# Patient Record
Sex: Female | Born: 1955 | Race: White | Hispanic: No | Marital: Married | Smoking: Never smoker
Health system: Southern US, Community
[De-identification: ages and names within clinical notes are randomized; demographics above are authoritative.]

## PROBLEM LIST (undated history)

## (undated) DIAGNOSIS — C801 Malignant (primary) neoplasm, unspecified: Secondary | ICD-10-CM

## (undated) DIAGNOSIS — J45909 Unspecified asthma, uncomplicated: Secondary | ICD-10-CM

## (undated) HISTORY — PX: MASTECTOMY: SHX3

## (undated) HISTORY — PX: BREAST SURGERY: SHX581

## (undated) HISTORY — PX: TONSILLECTOMY: SUR1361

---

## 2008-05-15 ENCOUNTER — Encounter: Payer: Self-pay | Admitting: Cardiology

## 2012-10-09 ENCOUNTER — Other Ambulatory Visit: Payer: Self-pay | Admitting: Gastroenterology

## 2012-10-16 ENCOUNTER — Encounter (HOSPITAL_BASED_OUTPATIENT_CLINIC_OR_DEPARTMENT_OTHER): Payer: Self-pay | Admitting: *Deleted

## 2012-10-16 ENCOUNTER — Emergency Department (HOSPITAL_BASED_OUTPATIENT_CLINIC_OR_DEPARTMENT_OTHER)
Admission: EM | Admit: 2012-10-16 | Discharge: 2012-10-16 | Disposition: A | Discharge status: Home / Self Care | Payer: BC Managed Care – PPO | Attending: Emergency Medicine | Admitting: Emergency Medicine

## 2012-10-16 ENCOUNTER — Emergency Department (HOSPITAL_BASED_OUTPATIENT_CLINIC_OR_DEPARTMENT_OTHER): Payer: BC Managed Care – PPO

## 2012-10-16 DIAGNOSIS — Z792 Long term (current) use of antibiotics: Secondary | ICD-10-CM | POA: Insufficient documentation

## 2012-10-16 DIAGNOSIS — R109 Unspecified abdominal pain: Secondary | ICD-10-CM

## 2012-10-16 DIAGNOSIS — R112 Nausea with vomiting, unspecified: Secondary | ICD-10-CM | POA: Insufficient documentation

## 2012-10-16 DIAGNOSIS — R1032 Left lower quadrant pain: Secondary | ICD-10-CM | POA: Insufficient documentation

## 2012-10-16 DIAGNOSIS — R824 Acetonuria: Secondary | ICD-10-CM | POA: Insufficient documentation

## 2012-10-16 DIAGNOSIS — Z853 Personal history of malignant neoplasm of breast: Secondary | ICD-10-CM | POA: Insufficient documentation

## 2012-10-16 DIAGNOSIS — R197 Diarrhea, unspecified: Secondary | ICD-10-CM | POA: Insufficient documentation

## 2012-10-16 HISTORY — DX: Malignant (primary) neoplasm, unspecified: C80.1

## 2012-10-16 LAB — CBC WITH DIFFERENTIAL/PLATELET
Basophils Absolute: 0 10*3/uL (ref 0.0–0.1)
Basophils Relative: 0 % (ref 0–1)
Eosinophils Relative: 1 % (ref 0–5)
HCT: 39.6 % (ref 36.0–46.0)
MCH: 29.2 pg (ref 26.0–34.0)
MCHC: 35.1 g/dL (ref 30.0–36.0)
MCV: 83.2 fL (ref 78.0–100.0)
Monocytes Absolute: 0.9 10*3/uL (ref 0.1–1.0)
RDW: 13.5 % (ref 11.5–15.5)

## 2012-10-16 LAB — URINALYSIS, ROUTINE W REFLEX MICROSCOPIC
Glucose, UA: NEGATIVE mg/dL
Leukocytes, UA: NEGATIVE
Nitrite: NEGATIVE
Protein, ur: NEGATIVE mg/dL
pH: 5 (ref 5.0–8.0)

## 2012-10-16 LAB — COMPREHENSIVE METABOLIC PANEL
AST: 35 U/L (ref 0–37)
Albumin: 4.2 g/dL (ref 3.5–5.2)
Calcium: 9.9 mg/dL (ref 8.4–10.5)
Creatinine, Ser: 1 mg/dL (ref 0.50–1.10)
GFR calc non Af Amer: 62 mL/min — ABNORMAL LOW (ref >90)

## 2012-10-16 LAB — LIPASE, BLOOD: Lipase: 37 U/L (ref 11–59)

## 2012-10-16 LAB — URINE MICROSCOPIC-ADD ON

## 2012-10-16 MED ORDER — IOHEXOL 300 MG/ML  SOLN
50.0000 mL | Freq: Once | INTRAMUSCULAR | Status: AC | PRN
  Administered 2012-10-16: 50 mL via ORAL

## 2012-10-16 MED ORDER — IOHEXOL 300 MG/ML  SOLN
100.0000 mL | Freq: Once | INTRAMUSCULAR | Status: AC | PRN
  Administered 2012-10-16: 100 mL via INTRAVENOUS

## 2012-10-16 MED ORDER — ONDANSETRON HCL 4 MG/2ML IJ SOLN
INTRAMUSCULAR | Status: AC
  Administered 2012-10-16: 4 mg via INTRAVENOUS
  Filled 2012-10-16: qty 2

## 2012-10-16 MED ORDER — ONDANSETRON HCL 4 MG/2ML IJ SOLN
4.0000 mg | Freq: Once | INTRAMUSCULAR | Status: AC
  Administered 2012-10-16: 4 mg via INTRAVENOUS

## 2012-10-16 MED ORDER — SODIUM CHLORIDE 0.9 % IV SOLN
Freq: Once | INTRAVENOUS | Status: AC
  Administered 2012-10-16: 21:00:00 via INTRAVENOUS

## 2012-10-16 MED ORDER — SODIUM CHLORIDE 0.9 % IV SOLN
1000.0000 mL | Freq: Once | INTRAVENOUS | Status: AC
  Administered 2012-10-16: 1000 mL via INTRAVENOUS

## 2012-10-16 MED ORDER — ONDANSETRON HCL 4 MG/2ML IJ SOLN
4.0000 mg | Freq: Once | INTRAMUSCULAR | Status: AC
  Administered 2012-10-16: 4 mg via INTRAVENOUS
  Filled 2012-10-16: qty 2

## 2012-10-16 MED ORDER — ONDANSETRON 8 MG PO TBDP: 8.0000 mg | ORAL_TABLET | Freq: Three times a day (TID) | ORAL | Status: DC | PRN

## 2012-10-16 NOTE — ED Notes (Addendum)
Vomiting diarrhea abdominal bloating x 2 days. Pale. Was recently treated with Augmentin for a sinus infection

## 2012-10-16 NOTE — ED Notes (Signed)
Pt. Walked to restroom and still unable to urinate.

## 2012-10-16 NOTE — ED Provider Notes (Signed)
History  This chart was scribed for Gerhard Munch, MD by Bennett Scrape, ED Scribe. This patient was seen in room MH03/MH03 and the patient's care was started at 6:45 PM.  CSN: [id number]  Arrival date & time 10/16/12  1825   First MD Initiated Contact with Patient 10/16/12 1845      Chief Complaint  Patient presents with  . Emesis     The history is provided by the patient. No language interpreter was used.    Kelli Crane is a 57 y.o. female who presents to the Emergency Department complaining of 2 days of gradual onset, non-changing, constant nausea with associated non-bloody emesis, non-bloody diarrhea and LLQ abdominal pain that started yesterday. Last BM was earlier today and last episode of emesis was 2 hours ago. She reports having abdominal pain currently.She reports breast CA in 2009 that was treated with one year of chemotherapy and CLL in 2012 treated with 6 chemotherapy treatments in 8 weeks, last treatment was June 2013. She states that she is currently CA free. She states that she has experienced prior episode of the same when she was receiving chemotherapy. She also states that she was recntly started on antibiotics for a sinus infection 3 weeks ago that was recent changed to Augmentin on week ago. She reports continuous post-nasal drip since the change. She denies fevers, chills, CP, SOB, leg swelling, and rash as associated symptoms. She denies smoking and alcohol use.  Oncologist is Dr. Hanley Hays in [facility], sees him every 3 months  Past Medical History  Diagnosis Date  . Cancer     Past Surgical History  Procedure Laterality Date  . Mastectomy    . Tonsillectomy    tubal ligation per pt at the bedside  No family history on file.  History  Substance Use Topics  . Smoking status: Never Smoker   . Smokeless tobacco: Not on file  . Alcohol Use: No    No OB history provided.  Review of Systems  Constitutional:       Per HPI, otherwise negative   HENT:       Per HPI, otherwise negative  Respiratory:       Per HPI, otherwise negative  Cardiovascular:       Per HPI, otherwise negative  Gastrointestinal: Positive for nausea, vomiting, abdominal pain and diarrhea. Negative for blood in stool.  Endocrine:       Negative aside from HPI  Genitourinary:       Neg aside from HPI   Musculoskeletal:       Per HPI, otherwise negative  Skin: Negative.   Neurological: Negative for syncope.    Allergies  Erythromycin  Home Medications   Current Outpatient Rx  Name  Route  Sig  Dispense  Refill  . Amoxicillin-Pot Clavulanate (AUGMENTIN PO)   Oral   Take by mouth.           Triage Vitals: BP 117/73  Pulse 91  Temp(Src) 98.2 F (36.8 C) (Oral)  Resp 18  SpO2 100%  Physical Exam  Nursing note and vitals reviewed. Constitutional: She is oriented to person, place, and time. She appears well-developed and well-nourished. No distress.  HENT:  Head: Normocephalic and atraumatic.  Eyes: Conjunctivae and EOM are normal.  Neck: Neck supple. No tracheal deviation present.  Cardiovascular: Normal rate and regular rhythm.   Pulmonary/Chest: Effort normal and breath sounds normal. No stridor. No respiratory distress.  Abdominal: Soft. She exhibits no distension. There is tenderness (diffuse  abdominal tenderness, worse in the LLQ). There is no rebound and no guarding.  Musculoskeletal: Normal range of motion. She exhibits no edema.  Neurological: She is alert and oriented to person, place, and time. No cranial nerve deficit.  Skin: Skin is warm and dry.  Psychiatric: She has a normal mood and affect. Her behavior is normal.    ED Course  Procedures (including critical care time)  DIAGNOSTIC STUDIES: Oxygen Saturation is 100% on room air, normal by my interpretation.    COORDINATION OF CARE: 7:11 PM-Discussed treatment plan which includes CT of abdomen, CBC, UA and lipase with pt at bedside and pt agreed to plan.   7:15 PM-  Ordered 4 mg Zofan injection  8:29 PM- Ordered 4 mg Zofran injection  Labs Reviewed  CBC WITH DIFFERENTIAL - Abnormal; Notable for the following:    Platelets 127 (*)    All other components within normal limits  COMPREHENSIVE METABOLIC PANEL - Abnormal; Notable for the following:    BUN 25 (*)    GFR calc non Af Amer 62 (*)    GFR calc Af Amer 72 (*)    All other components within normal limits  URINALYSIS, ROUTINE W REFLEX MICROSCOPIC - Abnormal; Notable for the following:    Hgb urine dipstick TRACE (*)    Bilirubin Urine SMALL (*)    Ketones, ur >80 (*)    All other components within normal limits  LIPASE, BLOOD  URINE MICROSCOPIC-ADD ON   No results found.   No diagnosis found.  10:56 PM The patient and her daughter were made aware of all results.  She has significant improvement since admission.  MDM   I personally performed the services described in this documentation, which was scribed in my presence. The recorded information has been reviewed and is accurate.  This 57 year old female presents with ongoing nausea, vomiting, diarrhea.  On exam the patient is afebrile, mentating appropriately.  The patient presents with IV fluids, and CT scan did demonstrate acute findings.  The patient was made aware of her need for repeat evaluation of her liver abnormality.  The patient's labs are suggestive of dehydration with ketonuria.  The patient has normal bicarbonate, and improved substantially with fluids.  We discussed return precautions, and the patient was discharged in stable condition.    Gerhard Munch, MD 10/16/12 2258

## 2012-10-16 NOTE — ED Notes (Signed)
Pt. Was given zofran at 2032.  Linking the med was a problem in the computer.

## 2013-11-19 ENCOUNTER — Other Ambulatory Visit: Payer: Self-pay | Admitting: Gastroenterology

## 2015-01-08 ENCOUNTER — Other Ambulatory Visit: Payer: Self-pay | Admitting: Gastroenterology

## 2015-02-11 ENCOUNTER — Other Ambulatory Visit: Payer: Self-pay | Admitting: Gastroenterology

## 2015-04-07 ENCOUNTER — Other Ambulatory Visit: Payer: Self-pay | Admitting: Gastroenterology

## 2015-05-12 ENCOUNTER — Other Ambulatory Visit: Payer: Self-pay | Admitting: Gastroenterology

## 2015-06-06 ENCOUNTER — Other Ambulatory Visit: Payer: Self-pay | Admitting: Gastroenterology

## 2015-09-26 ENCOUNTER — Other Ambulatory Visit: Payer: Self-pay | Admitting: Gastroenterology

## 2016-01-14 ENCOUNTER — Other Ambulatory Visit: Payer: Self-pay | Admitting: Gastroenterology

## 2016-08-16 ENCOUNTER — Other Ambulatory Visit: Payer: Self-pay | Admitting: Gastroenterology

## 2017-01-21 ENCOUNTER — Encounter: Payer: Self-pay | Admitting: Emergency Medicine

## 2017-01-21 ENCOUNTER — Emergency Department (INDEPENDENT_AMBULATORY_CARE_PROVIDER_SITE_OTHER)
Admission: EM | Admit: 2017-01-21 | Discharge: 2017-01-21 | Disposition: A | Discharge status: Home / Self Care | Payer: BLUE CROSS/BLUE SHIELD | Source: Home / Self Care | Attending: Family Medicine | Admitting: Family Medicine

## 2017-01-21 DIAGNOSIS — Q846 Other congenital malformations of nails: Secondary | ICD-10-CM | POA: Diagnosis not present

## 2017-01-21 HISTORY — DX: Unspecified asthma, uncomplicated: J45.909

## 2017-01-21 MED ORDER — MUPIROCIN CALCIUM 2 % EX CREA: 1.0000 "application " | TOPICAL_CREAM | Freq: Three times a day (TID) | CUTANEOUS | 0 refills | Status: DC

## 2017-01-21 NOTE — ED Triage Notes (Signed)
Left great toe infection started on 4/27 took Bactrim didn't get better then was put on Clindamycin which caused vomiting and diarrhea.Patient is from [place], here visiting

## 2017-01-21 NOTE — ED Provider Notes (Signed)
[facility]    CSN: [id number] Arrival date & time: 01/21/17  1902     History   Chief Complaint Chief Complaint  Patient presents with  . Toe Pain    HPI Kelli Crane is a 61 y.o. female.   Patient is visiting from [place].  About one month ago she was prescribed Bactrim for a left great toe infection.  She had no improvement and was then prescribed Clindamycin, which caused vomiting and diarrhea.  Her GI symptoms resolved when she discontinued the Clindamycin.  She still has some soreness at the base of her toenail, although improved considerably.  No drainage from the toe.   The history is provided by the patient.  Toe Pain  This is a new problem. Episode onset: one month ago. The problem occurs constantly. The problem has been gradually improving. The symptoms are aggravated by walking. Nothing relieves the symptoms. Treatments tried: clindamycin. The treatment provided significant relief.    Past Medical History:  Diagnosis Date  . Asthma   . Cancer ([place])     There are no active problems to display for this patient.   Past Surgical History:  Procedure Laterality Date  . BREAST SURGERY    . MASTECTOMY    . TONSILLECTOMY      OB History    No data available       Home Medications    Prior to Admission medications   Medication Sig Start Date End Date Taking? Authorizing Provider  cetirizine (ZYRTEC) 10 MG tablet Take 10 mg by mouth daily.   Yes [provider]  Ibrutinib 420 MG TABS Take by mouth.   Yes [provider]  lansoprazole (PREVACID) 30 MG capsule Take 30 mg by mouth daily at 12 noon.   Yes [provider]  mometasone (NASONEX) 50 MCG/ACT nasal spray Place 2 sprays into the nose daily.   Yes [provider]  montelukast (SINGULAIR) 10 MG tablet Take 10 mg by mouth at bedtime.   Yes [provider]  mupirocin cream (BACTROBAN) 2 % Apply 1 application topically 3 (three) times daily.  01/21/17   Kandra Nicolas, MD    Family History Family History  Problem Relation Age of Onset  . Hypertension Mother   . Depression Mother   . Cancer Mother   . Cancer Sister     Social History Social History  Substance Use Topics  . Smoking status: Never Smoker  . Smokeless tobacco: Never Used  . Alcohol use No     Allergies   Amoxicillin; Clarithromycin; Clindamycin/lincomycin; and Erythromycin   Review of Systems Review of Systems  All other systems reviewed and are negative.    Physical Exam Triage Vital Signs ED Triage Vitals [01/21/17 1917]  Enc Vitals Group     BP 114/69     Pulse Rate 81     Resp      Temp 98.4 F (36.9 C)     Temp Source Oral     SpO2 95 %     Weight 150 lb (68 kg)     Height 5\' 8"  (1.727 m)     Head Circumference      Peak Flow      Pain Score 1     Pain Loc      Pain Edu?      Excl. in [place]?    No data found.   Updated Vital Signs BP 114/69 (BP Location: Left Arm)   Pulse  81   Temp 98.4 F (36.9 C) (Oral)   Ht 5\' 8"  (1.727 m)   Wt 150 lb (68 kg)   SpO2 95%   BMI 22.81 kg/m   Visual Acuity Right Eye Distance:   Left Eye Distance:   Bilateral Distance:    Right Eye Near:   Left Eye Near:    Bilateral Near:     Physical Exam  Constitutional: She appears well-developed and well-nourished. No distress.  HENT:  Head: Normocephalic.  Eyes: Pupils are equal, round, and reactive to light.  Cardiovascular: Normal rate.   Pulmonary/Chest: Effort normal.  Musculoskeletal:       Feet:  Left great toe has mild erythema and tenderness dorsally at the base of the nail, but no fluctuance, induration, or swelling.  Toe has full range of motion.  Neurological: She is alert.  Skin: Skin is warm and dry.  Nursing note and vitals reviewed.    UC Treatments / Results  Labs (all labs ordered are listed, but only abnormal results are displayed) Labs Reviewed - No data to display  EKG  EKG Interpretation None        Radiology No results found.  Procedures Procedures (including critical care time)  Medications Ordered in UC Medications - No data to display   Initial Impression / Assessment and Plan / UC Course  I have reviewed the triage vital signs and the nursing notes.  Pertinent labs & imaging results that were available during my care of the patient were reviewed by me and considered in my medical decision making (see chart for details).    Eponychia almost resolved.  Begin Bactroban cream TID until healed. May continue warm soaks for 2 to 3 more days. Return for worsening symptoms.    Final Clinical Impressions(s) / UC Diagnoses   Final diagnoses:  Eponychia    New Prescriptions New Prescriptions   MUPIROCIN CREAM (BACTROBAN) 2 %    Apply 1 application topically 3 (three) times daily.     Kandra Nicolas, MD 01/23/17 [phone]

## 2017-01-21 NOTE — Discharge Instructions (Signed)
May continue warm soaks for 2 to 3 more days.

## 2017-01-31 ENCOUNTER — Other Ambulatory Visit: Payer: Self-pay | Admitting: Gastroenterology

## 2017-06-24 ENCOUNTER — Other Ambulatory Visit: Payer: Self-pay | Admitting: Gastroenterology

## 2017-08-12 ENCOUNTER — Other Ambulatory Visit: Payer: Self-pay | Admitting: Gastroenterology

## 2017-12-05 ENCOUNTER — Encounter (HOSPITAL_BASED_OUTPATIENT_CLINIC_OR_DEPARTMENT_OTHER): Payer: Self-pay | Admitting: Emergency Medicine

## 2017-12-05 ENCOUNTER — Other Ambulatory Visit: Payer: Self-pay

## 2017-12-05 ENCOUNTER — Inpatient Hospital Stay (HOSPITAL_BASED_OUTPATIENT_CLINIC_OR_DEPARTMENT_OTHER)
Admission: EM | Admit: 2017-12-05 | Discharge: 2017-12-10 | DRG: 809 | Disposition: A | Discharge status: Home / Self Care | Payer: BLUE CROSS/BLUE SHIELD | Attending: Internal Medicine | Admitting: Internal Medicine

## 2017-12-05 ENCOUNTER — Emergency Department (HOSPITAL_BASED_OUTPATIENT_CLINIC_OR_DEPARTMENT_OTHER): Payer: BLUE CROSS/BLUE SHIELD

## 2017-12-05 DIAGNOSIS — C9190 Lymphoid leukemia, unspecified not having achieved remission: Secondary | ICD-10-CM | POA: Diagnosis not present

## 2017-12-05 DIAGNOSIS — Z79899 Other long term (current) drug therapy: Secondary | ICD-10-CM | POA: Diagnosis not present

## 2017-12-05 DIAGNOSIS — K219 Gastro-esophageal reflux disease without esophagitis: Secondary | ICD-10-CM | POA: Diagnosis present

## 2017-12-05 DIAGNOSIS — Z9221 Personal history of antineoplastic chemotherapy: Secondary | ICD-10-CM | POA: Diagnosis not present

## 2017-12-05 DIAGNOSIS — D61818 Other pancytopenia: Secondary | ICD-10-CM | POA: Diagnosis present

## 2017-12-05 DIAGNOSIS — D709 Neutropenia, unspecified: Secondary | ICD-10-CM | POA: Diagnosis not present

## 2017-12-05 DIAGNOSIS — D701 Agranulocytosis secondary to cancer chemotherapy: Secondary | ICD-10-CM | POA: Diagnosis present

## 2017-12-05 DIAGNOSIS — R5081 Fever presenting with conditions classified elsewhere: Secondary | ICD-10-CM | POA: Diagnosis present

## 2017-12-05 DIAGNOSIS — C9110 Chronic lymphocytic leukemia of B-cell type not having achieved remission: Secondary | ICD-10-CM | POA: Diagnosis present

## 2017-12-05 DIAGNOSIS — C50919 Malignant neoplasm of unspecified site of unspecified female breast: Secondary | ICD-10-CM | POA: Diagnosis present

## 2017-12-05 DIAGNOSIS — E876 Hypokalemia: Secondary | ICD-10-CM | POA: Diagnosis present

## 2017-12-05 DIAGNOSIS — T451X5A Adverse effect of antineoplastic and immunosuppressive drugs, initial encounter: Secondary | ICD-10-CM | POA: Diagnosis present

## 2017-12-05 DIAGNOSIS — Z888 Allergy status to other drugs, medicaments and biological substances status: Secondary | ICD-10-CM

## 2017-12-05 DIAGNOSIS — J4530 Mild persistent asthma, uncomplicated: Secondary | ICD-10-CM | POA: Diagnosis not present

## 2017-12-05 DIAGNOSIS — J45909 Unspecified asthma, uncomplicated: Secondary | ICD-10-CM | POA: Diagnosis present

## 2017-12-05 LAB — URINALYSIS, ROUTINE W REFLEX MICROSCOPIC
Bilirubin Urine: NEGATIVE
Glucose, UA: NEGATIVE mg/dL
Ketones, ur: NEGATIVE mg/dL
Nitrite: NEGATIVE
PH: 6.5 (ref 5.0–8.0)
Protein, ur: NEGATIVE mg/dL
SPECIFIC GRAVITY, URINE: 1.01 (ref 1.005–1.030)

## 2017-12-05 LAB — CBC WITH DIFFERENTIAL/PLATELET
BASOS PCT: 0 %
Basophils Absolute: 0 10*3/uL (ref 0.0–0.1)
EOS PCT: 0 %
Eosinophils Absolute: 0 10*3/uL (ref 0.0–0.7)
HEMATOCRIT: 29.8 % — AB (ref 36.0–46.0)
Hemoglobin: 10.8 g/dL — ABNORMAL LOW (ref 12.0–15.0)
LYMPHS ABS: 0.6 10*3/uL — AB (ref 0.7–4.0)
Lymphocytes Relative: 36 %
MCH: 30.3 pg (ref 26.0–34.0)
MCHC: 36.2 g/dL — AB (ref 30.0–36.0)
MCV: 83.5 fL (ref 78.0–100.0)
Monocytes Absolute: 0.9 10*3/uL (ref 0.1–1.0)
Monocytes Relative: 60 %
NEUTROS ABS: 0.1 10*3/uL — AB (ref 1.7–7.7)
Neutrophils Relative %: 4 %
Platelets: 133 10*3/uL — ABNORMAL LOW (ref 150–400)
RBC: 3.57 MIL/uL — AB (ref 3.87–5.11)
RDW: 13.7 % (ref 11.5–15.5)
WBC: 1.6 10*3/uL — ABNORMAL LOW (ref 4.0–10.5)

## 2017-12-05 LAB — COMPREHENSIVE METABOLIC PANEL
ALK PHOS: 79 U/L (ref 38–126)
ALT: 16 U/L (ref 14–54)
AST: 20 U/L (ref 15–41)
Albumin: 4.3 g/dL (ref 3.5–5.0)
Anion gap: 11 (ref 5–15)
BUN: 14 mg/dL (ref 6–20)
CALCIUM: 9.1 mg/dL (ref 8.9–10.3)
CHLORIDE: 103 mmol/L (ref 101–111)
CO2: 22 mmol/L (ref 22–32)
CREATININE: 0.96 mg/dL (ref 0.44–1.00)
GFR calc non Af Amer: >60 mL/min (ref >60)
GLUCOSE: 123 mg/dL — AB (ref 65–99)
Potassium: 3.4 mmol/L — ABNORMAL LOW (ref 3.5–5.1)
Sodium: 136 mmol/L (ref 135–145)
Total Bilirubin: 0.7 mg/dL (ref 0.3–1.2)
Total Protein: 6.9 g/dL (ref 6.5–8.1)

## 2017-12-05 LAB — URINALYSIS, MICROSCOPIC (REFLEX)

## 2017-12-05 LAB — I-STAT CG4 LACTIC ACID, ED
Lactic Acid, Venous: 1.22 mmol/L (ref 0.5–1.9)
Lactic Acid, Venous: 1.52 mmol/L (ref 0.5–1.9)

## 2017-12-05 LAB — PROTIME-INR
INR: 1
Prothrombin Time: 13.1 seconds (ref 11.4–15.2)

## 2017-12-05 MED ORDER — AZTREONAM 2 G IJ SOLR
2.0000 g | Freq: Once | INTRAMUSCULAR | Status: AC
  Administered 2017-12-05: 2 g via INTRAVENOUS
  Filled 2017-12-05: qty 2

## 2017-12-05 MED ORDER — VANCOMYCIN HCL IN DEXTROSE 750-5 MG/150ML-% IV SOLN
750.0000 mg | Freq: Two times a day (BID) | INTRAVENOUS | Status: DC
  Filled 2017-12-05: qty 150

## 2017-12-05 MED ORDER — AZTREONAM 2 G IJ SOLR
2.0000 g | Freq: Three times a day (TID) | INTRAMUSCULAR | Status: DC
  Filled 2017-12-05 (×2): qty 2

## 2017-12-05 MED ORDER — POTASSIUM CHLORIDE CRYS ER 20 MEQ PO TBCR
40.0000 meq | EXTENDED_RELEASE_TABLET | Freq: Once | ORAL | Status: AC
  Administered 2017-12-05: 40 meq via ORAL
  Filled 2017-12-05: qty 2

## 2017-12-05 MED ORDER — PROCHLORPERAZINE EDISYLATE 5 MG/ML IJ SOLN
5.0000 mg | Freq: Once | INTRAMUSCULAR | Status: AC
  Administered 2017-12-05: 5 mg via INTRAVENOUS
  Filled 2017-12-05: qty 2

## 2017-12-05 MED ORDER — AZTREONAM 1 G IJ SOLR
INTRAMUSCULAR | Status: AC
  Filled 2017-12-05: qty 2

## 2017-12-05 MED ORDER — VANCOMYCIN HCL IN DEXTROSE 1-5 GM/200ML-% IV SOLN
1000.0000 mg | Freq: Once | INTRAVENOUS | Status: AC
  Administered 2017-12-05: 1000 mg via INTRAVENOUS
  Filled 2017-12-05: qty 200

## 2017-12-05 MED ORDER — LEVOFLOXACIN IN D5W 750 MG/150ML IV SOLN
750.0000 mg | Freq: Once | INTRAVENOUS | Status: AC
  Administered 2017-12-05: 750 mg via INTRAVENOUS
  Filled 2017-12-05: qty 150

## 2017-12-05 MED ORDER — LEVOFLOXACIN IN D5W 750 MG/150ML IV SOLN: 750.0000 mg | INTRAVENOUS | Status: DC

## 2017-12-05 MED ORDER — ONDANSETRON HCL 4 MG/2ML IJ SOLN
4.0000 mg | Freq: Once | INTRAMUSCULAR | Status: AC
  Administered 2017-12-05: 4 mg via INTRAVENOUS
  Filled 2017-12-05: qty 2

## 2017-12-05 NOTE — ED Triage Notes (Signed)
Patient reports nausea, fever since today.  Reports that she is currently taking chemo for breast cancer.  States took compazine around 1400 today.

## 2017-12-05 NOTE — ED Notes (Signed)
Informed Bonney Roussel RN of Code Sepsis

## 2017-12-05 NOTE — Progress Notes (Signed)
Pharmacy Antibiotic Note  Kelli Crane is a 62 y.o. female admitted on 12/05/2017 with sepsis.  Pharmacy has been consulted for vancomycin, aztreonam, and levofloxaxcin dosing.  Tm 99.1, BP 108/68, RR 20  Received the following in the ED: Aztreonam 2g x 1 Levofloxacin 750 mg x 1 Vancomycin 1g x 1  Plan: Vancomycin 750 mg q12h Aztreonam 2 g q8h Levofloxacin 750 mg q24h F/U cultures, LOT, renal fxn, vanc trough prn  Height: 5\' 8"  (172.7 cm) Weight: 145 lb (65.8 kg) IBW/kg (Calculated) : 63.9  Temp (24hrs), Avg:99.1 F (37.3 C), Min:99.1 F (37.3 C), Max:99.1 F (37.3 C)  Recent Labs  Lab 12/05/17 1843  LATICACIDVEN 1.22    CrCl cannot be calculated (Patient's most recent lab result is older than the maximum 21 days allowed.).    Allergies  Allergen Reactions  . Amoxicillin   . Clarithromycin   . Clindamycin/Lincomycin   . Erythromycin Nausea And Vomiting    Antimicrobials this admission: Levaquin 4/8 >> Azactam 4/8 >> Vancomycin 4/8 >>  Dose adjustments this admission: none  Microbiology results: 4/8 BCx: sent  Thank you for allowing pharmacy to be a part of this patient's care.  Orma Render 12/05/2017 6:57 PM

## 2017-12-05 NOTE — ED Notes (Signed)
Pt. In  CT at this time.  

## 2017-12-05 NOTE — ED Provider Notes (Addendum)
[facility] EMERGENCY DEPARTMENT Provider Note   CSN: [id number] Arrival date & time: 12/05/17  1759     History   Chief Complaint Chief Complaint  Patient presents with  . Fever    HPI Kelli Crane is a 62 y.o. female.  Patient developed fever of 101.7 this afternoon.  Other associated symptoms include sinus congestion and feeling of vertigo.  She reports she gets vertigo whenever she gets a fever.  She vomited x1.  She presently feels nauseated.  She treated herself with Sudafed however vomited immediately after taking Sudafed.  No other associated symptoms.  Nothing makes symptoms better or worse.  She presently feels "rundown"  HPI  Past Medical History:  Diagnosis Date  . Asthma   . Cancer [facility])   Breast cancer in remission currently receiving chemotherapy for CLL.  Last chemotherapy was 10 days ago  There are no active problems to display for this patient.   Past Surgical History:  Procedure Laterality Date  . BREAST SURGERY    . MASTECTOMY    . TONSILLECTOMY       OB History   None      Home Medications    Prior to Admission medications   Medication Sig Start Date End Date Taking? Authorizing Provider  cetirizine (ZYRTEC) 10 MG tablet Take 10 mg by mouth daily.    [provider]  Ibrutinib 420 MG TABS Take by mouth.    [provider]  lansoprazole (PREVACID) 30 MG capsule Take 30 mg by mouth daily at 12 noon.    [provider]  mometasone (NASONEX) 50 MCG/ACT nasal spray Place 2 sprays into the nose daily.    [provider]  montelukast (SINGULAIR) 10 MG tablet Take 10 mg by mouth at bedtime.    [provider]  mupirocin cream (BACTROBAN) 2 % Apply 1 application topically 3 (three) times daily. 01/21/17   Kandra Nicolas, MD    Family History Family History  Problem Relation Age of Onset  . Hypertension Mother   . Depression Mother   . Cancer Mother   . Cancer Sister     Social  History Social History   Tobacco Use  . Smoking status: Never Smoker  . Smokeless tobacco: Never Used  Substance Use Topics  . Alcohol use: No  . Drug use: No  Resides in [place]   Allergies   Amoxicillin; Clarithromycin; Clindamycin/lincomycin; and Erythromycin   Review of Systems Review of Systems  Constitutional: Positive for fever.  HENT: Negative.   Respiratory: Negative.   Cardiovascular: Negative.   Gastrointestinal: Positive for nausea and vomiting.  Musculoskeletal: Negative.   Skin: Negative.   Allergic/Immunologic: Positive for immunocompromised state.  Neurological: Positive for dizziness.  Psychiatric/Behavioral: Negative.   All other systems reviewed and are negative.    Physical Exam Updated Vital Signs BP 108/68 (BP Location: Right Arm)   Pulse (!) 117   Temp 99.1 F (37.3 C) (Oral)   Resp 20   Ht 5\' 8"  (1.727 m)   Wt 65.8 kg (145 lb)   SpO2 100%   BMI 22.05 kg/m   Physical Exam  Constitutional: She appears well-developed and well-nourished. No distress.  HENT:  Head: Normocephalic and atraumatic.  Nose: Nose normal.  Mouth/Throat: Oropharynx is clear and moist.  Eyes: Pupils are equal, round, and reactive to light. Conjunctivae are normal.  Neck: Neck supple. No tracheal deviation present. No thyromegaly present.  Cardiovascular: Regular rhythm.  No murmur heard.  Mildly tachycardic  Pulmonary/Chest: Effort normal and breath sounds normal.  Abdominal: Soft. Bowel sounds are normal. She exhibits no distension. There is no tenderness.  Musculoskeletal: Normal range of motion. She exhibits no edema or tenderness.  Neurological: She is alert. Coordination normal.  Skin: Skin is warm and dry. No rash noted.  Psychiatric: She has a normal mood and affect.  Nursing note and vitals reviewed.    ED Treatments / Results  Labs (all labs ordered are listed, but only abnormal results are displayed) Labs Reviewed  CULTURE, BLOOD (ROUTINE  X 2)  CULTURE, BLOOD (ROUTINE X 2)  URINE CULTURE  COMPREHENSIVE METABOLIC PANEL  CBC WITH DIFFERENTIAL/PLATELET  PROTIME-INR  URINALYSIS, ROUTINE W REFLEX MICROSCOPIC  I-STAT CG4 LACTIC ACID, ED  I-STAT CG4 LACTIC ACID, ED   Results for orders placed or performed during the hospital encounter of 12/05/17  Comprehensive metabolic panel  Result Value Ref Range   Sodium 136 135 - 145 mmol/L   Potassium 3.4 (L) 3.5 - 5.1 mmol/L   Chloride 103 101 - 111 mmol/L   CO2 22 22 - 32 mmol/L   Glucose, Bld 123 (H) 65 - 99 mg/dL   BUN 14 6 - 20 mg/dL   Creatinine, Ser 0.96 0.44 - 1.00 mg/dL   Calcium 9.1 8.9 - 10.3 mg/dL   Total Protein 6.9 6.5 - 8.1 g/dL   Albumin 4.3 3.5 - 5.0 g/dL   AST 20 15 - 41 U/L   ALT 16 14 - 54 U/L   Alkaline Phosphatase 79 38 - 126 U/L   Total Bilirubin 0.7 0.3 - 1.2 mg/dL   GFR calc non Af Amer >60 >60 mL/min   GFR calc Af Amer >60 >60 mL/min   Anion gap 11 5 - 15  CBC with Differential  Result Value Ref Range   WBC 1.6 (L) 4.0 - 10.5 K/uL   RBC 3.57 (L) 3.87 - 5.11 MIL/uL   Hemoglobin 10.8 (L) 12.0 - 15.0 g/dL   HCT 29.8 (L) 36.0 - 46.0 %   MCV 83.5 78.0 - 100.0 fL   MCH 30.3 26.0 - 34.0 pg   MCHC 36.2 (H) 30.0 - 36.0 g/dL   RDW 13.7 11.5 - 15.5 %   Platelets 133 (L) 150 - 400 K/uL   Neutrophils Relative % 4 %   Lymphocytes Relative 36 %   Monocytes Relative 60 %   Eosinophils Relative 0 %   Basophils Relative 0 %   Neutro Abs 0.1 (L) 1.7 - 7.7 K/uL   Lymphs Abs 0.6 (L) 0.7 - 4.0 K/uL   Monocytes Absolute 0.9 0.1 - 1.0 K/uL   Eosinophils Absolute 0.0 0.0 - 0.7 K/uL   Basophils Absolute 0.0 0.0 - 0.1 K/uL   WBC Morphology ATYPICAL LYMPHOCYTES   Protime-INR  Result Value Ref Range   Prothrombin Time 13.1 11.4 - 15.2 seconds   INR 1.00   Urinalysis, Routine w reflex microscopic  Result Value Ref Range   Color, Urine YELLOW YELLOW   APPearance CLEAR CLEAR   Specific Gravity, Urine 1.010 1.005 - 1.030   pH 6.5 5.0 - 8.0   Glucose, UA NEGATIVE  NEGATIVE mg/dL   Hgb urine dipstick TRACE (A) NEGATIVE   Bilirubin Urine NEGATIVE NEGATIVE   Ketones, ur NEGATIVE NEGATIVE mg/dL   Protein, ur NEGATIVE NEGATIVE mg/dL   Nitrite NEGATIVE NEGATIVE   Leukocytes, UA MODERATE (A) NEGATIVE  Urinalysis, Microscopic (reflex)  Result Value Ref Range   RBC / HPF 0-5 0 -  5 RBC/hpf   WBC, UA 6-30 0 - 5 WBC/hpf   Bacteria, UA FEW (A) NONE SEEN   Squamous Epithelial / LPF 0-5 (A) NONE SEEN   Mucus PRESENT   I-Stat CG4 Lactic Acid, ED  Result Value Ref Range   Lactic Acid, Venous 1.22 0.5 - 1.9 mmol/L   Dg Chest 2 View  Result Date: 12/05/2017 CLINICAL DATA:  Fever, vertigo. EXAM: CHEST - 2 VIEW COMPARISON:  None. FINDINGS: The heart size and mediastinal contours are within normal limits. Both lungs are clear. No pneumothorax or pleural effusion is noted. The visualized skeletal structures are unremarkable. IMPRESSION: No active cardiopulmonary disease. Electronically Signed   By: Marijo Conception, M.D.   On: 12/05/2017 19:02   Ct Maxillofacial Wo Contrast  Result Date: 12/05/2017 CLINICAL DATA:  Severe frontal headache x3 days with pain behind the maxillary sinuses. Fever. History of breast cancer and asthma. Suspect sinusitis. EXAM: CT MAXILLOFACIAL WITHOUT CONTRAST TECHNIQUE: Multidetector CT imaging of the maxillofacial structures was performed. Multiplanar CT image reconstructions were also generated. COMPARISON:  None. FINDINGS: Osseous: No fracture or mandibular dislocation. No destructive process. Mild dextroconvex curvature of the nasal septum. Orbits: Negative. No traumatic or inflammatory finding. Sinuses: Moderate circumferential mucosal thickening of the left maxillary sinus without air-fluid levels. Underdeveloped frontal sinus. Right maxillary, ethmoid and sphenoid sinuses are nonacute. Soft tissues: Unremarkable Limited intracranial: Nonacute IMPRESSION: Moderate circumferential left maxillary sinus mucosal thickening consistent with chronic  sinusitis. Electronically Signed   By: Ashley Royalty M.D.   On: 12/05/2017 19:13   EKG None  EKG Interpretation  Date/Time:  Monday December 05 2017 19:06:20 EDT Ventricular Rate:  106 PR Interval:    QRS Duration: 77 QT Interval:  322 QTC Calculation: 428 R Axis:   86 Text Interpretation:  Sinus tachycardia Borderline right axis deviation No old tracing to compare Confirmed by Orlie Dakin [phone]) on 12/05/2017 7:15:18 PM     Chest x-ray viewed by me  Radiology No results found.  Procedures Procedures (including critical care time)  Medications Ordered in ED Medications  levofloxacin (LEVAQUIN) IVPB 750 mg (has no administration in time range)  aztreonam (AZACTAM) 2 g in sodium chloride 0.9 % 100 mL IVPB (has no administration in time range)  vancomycin (VANCOCIN) IVPB 1000 mg/200 mL premix (has no administration in time range)  ondansetron (ZOFRAN) injection 4 mg (has no administration in time range)    Code sepsis called by me.  Sirs criteria include temperature and heart rate.  Source of infection unclear at this point with Zofran ordered for nausea Initial Impression / Assessment and Plan / ED Course  I have reviewed the triage vital signs and the nursing notes.  Pertinent labs & imaging results that were available during my care of the patient were reviewed by me and considered in my medical decision making (see chart for details).     9:20 PM patient resting comfortably after treatment with intravenous hydration, IV antibiotics and antiemetics.  Nausea has resolved.  Only one blood culture could be obtained.  I feel the benefit of antibiotics outweighed multiple other attempts at obtaining second blood culture. Consulted Dr.Kakrakady who accepted patient in transfer Sepsis - Repeat Assessment  Performed at:    920 pm  Vitals     Blood pressure 123/71, pulse (!) 107, temperature (!) 101 F (38.3 C), temperature source Oral, resp. rate 17, height 5\' 8"  (1.727 m),  weight 65.8 kg (145 lb), SpO2 95 %.  Heart:  Regular rate and rhythm  Lungs:    CTA  Capillary Refill:   <2 sec  Peripheral Pulse:   Radial pulse palpable  Skin:     Normal Color  Lab work consistent with neutropenia, hypokalemia and anemia Final Clinical Impressions(s) / ED Diagnoses  Diagnosis neutropenic fever Final diagnoses:  None    ED Discharge Orders    None       Orlie Dakin, MD 12/05/17 2132 CRITICAL CARE Performed by: Orlie Dakin Total critical care time: 30 minutes Critical care time was exclusive of separately billable procedures and treating other patients. Critical care was necessary to treat or prevent imminent or life-threatening deterioration. Critical care was time spent personally by me on the following activities: development of treatment plan with patient and/or surrogate as well as nursing, discussions with consultants, evaluation of patient's response to treatment, examination of patient, obtaining history from patient or surrogate, ordering and performing treatments and interventions, ordering and review of laboratory studies, ordering and review of radiographic studies, pulse oximetry and re-evaluation of patient's condition.   Orlie Dakin, MD 12/19/17 [phone]

## 2017-12-05 NOTE — ED Notes (Signed)
Reported to EDP Dr.J that Unable to obtains second set of blood cultures.  OK to start the antibiotics per EDP Dr. Lenna Sciara.

## 2017-12-05 NOTE — ED Notes (Signed)
Pt. Felt sick today and her temp was going up.  Pt. Felt she had vertigo.  Pt. Started with dry heaves and then vomited until dry heaving.  Pt. Is in middle of cycles of chemo for CLL.  Pt. Has been hospitalized for this in past.

## 2017-12-06 ENCOUNTER — Encounter (HOSPITAL_COMMUNITY): Payer: Self-pay | Admitting: Family Medicine

## 2017-12-06 DIAGNOSIS — D61818 Other pancytopenia: Secondary | ICD-10-CM | POA: Diagnosis present

## 2017-12-06 DIAGNOSIS — J45909 Unspecified asthma, uncomplicated: Secondary | ICD-10-CM | POA: Diagnosis present

## 2017-12-06 DIAGNOSIS — E876 Hypokalemia: Secondary | ICD-10-CM | POA: Diagnosis present

## 2017-12-06 DIAGNOSIS — C50919 Malignant neoplasm of unspecified site of unspecified female breast: Secondary | ICD-10-CM | POA: Diagnosis present

## 2017-12-06 DIAGNOSIS — C9110 Chronic lymphocytic leukemia of B-cell type not having achieved remission: Secondary | ICD-10-CM | POA: Diagnosis present

## 2017-12-06 LAB — CBC WITH DIFFERENTIAL/PLATELET
BASOS ABS: 0 10*3/uL (ref 0.0–0.1)
Band Neutrophils: 0 %
Basophils Relative: 2 %
Blasts: 0 %
EOS PCT: 0 %
Eosinophils Absolute: 0 10*3/uL (ref 0.0–0.7)
HCT: 26.3 % — ABNORMAL LOW (ref 36.0–46.0)
Hemoglobin: 9.2 g/dL — ABNORMAL LOW (ref 12.0–15.0)
Lymphocytes Relative: 70 %
Lymphs Abs: 1.2 10*3/uL (ref 0.7–4.0)
MCH: 29 pg (ref 26.0–34.0)
MCHC: 35 g/dL (ref 30.0–36.0)
MCV: 83 fL (ref 78.0–100.0)
METAMYELOCYTES PCT: 0 %
MYELOCYTES: 0 %
Monocytes Absolute: 0.5 10*3/uL (ref 0.1–1.0)
Monocytes Relative: 27 %
Neutro Abs: 0 10*3/uL — ABNORMAL LOW (ref 1.7–7.7)
Neutrophils Relative %: 1 %
Other: 0 %
PLATELETS: 108 10*3/uL — AB (ref 150–400)
Promyelocytes Relative: 0 %
RBC: 3.17 MIL/uL — AB (ref 3.87–5.11)
RDW: 14.3 % (ref 11.5–15.5)
WBC: 1.7 10*3/uL — ABNORMAL LOW (ref 4.0–10.5)
nRBC: 0 /100 WBC

## 2017-12-06 LAB — RESPIRATORY PANEL BY PCR
Adenovirus: NOT DETECTED
BORDETELLA PERTUSSIS-RVPCR: NOT DETECTED
CHLAMYDOPHILA PNEUMONIAE-RVPPCR: NOT DETECTED
Coronavirus 229E: NOT DETECTED
Coronavirus HKU1: NOT DETECTED
Coronavirus NL63: NOT DETECTED
Coronavirus OC43: NOT DETECTED
INFLUENZA A-RVPPCR: NOT DETECTED
INFLUENZA B-RVPPCR: NOT DETECTED
MYCOPLASMA PNEUMONIAE-RVPPCR: NOT DETECTED
Metapneumovirus: NOT DETECTED
PARAINFLUENZA VIRUS 4-RVPPCR: NOT DETECTED
Parainfluenza Virus 1: NOT DETECTED
Parainfluenza Virus 2: NOT DETECTED
Parainfluenza Virus 3: NOT DETECTED
Respiratory Syncytial Virus: NOT DETECTED
Rhinovirus / Enterovirus: NOT DETECTED

## 2017-12-06 LAB — BASIC METABOLIC PANEL
ANION GAP: 8 (ref 5–15)
BUN: 9 mg/dL (ref 6–20)
CO2: 18 mmol/L — ABNORMAL LOW (ref 22–32)
Calcium: 7.8 mg/dL — ABNORMAL LOW (ref 8.9–10.3)
Chloride: 112 mmol/L — ABNORMAL HIGH (ref 101–111)
Creatinine, Ser: 0.88 mg/dL (ref 0.44–1.00)
GFR calc Af Amer: >60 mL/min (ref >60)
GFR calc non Af Amer: >60 mL/min (ref >60)
GLUCOSE: 99 mg/dL (ref 65–99)
POTASSIUM: 3.4 mmol/L — AB (ref 3.5–5.1)
Sodium: 138 mmol/L (ref 135–145)

## 2017-12-06 LAB — HIV ANTIBODY (ROUTINE TESTING W REFLEX): HIV Screen 4th Generation wRfx: NONREACTIVE

## 2017-12-06 LAB — MAGNESIUM: Magnesium: 1.4 mg/dL — ABNORMAL LOW (ref 1.7–2.4)

## 2017-12-06 LAB — PATHOLOGIST SMEAR REVIEW

## 2017-12-06 MED ORDER — MAGNESIUM SULFATE 2 GM/50ML IV SOLN
2.0000 g | Freq: Once | INTRAVENOUS | Status: AC
  Administered 2017-12-06: 2 g via INTRAVENOUS
  Filled 2017-12-06: qty 50

## 2017-12-06 MED ORDER — SODIUM CHLORIDE 0.9 % IV BOLUS
1000.0000 mL | Freq: Once | INTRAVENOUS | Status: AC
  Administered 2017-12-06: 1000 mL via INTRAVENOUS

## 2017-12-06 MED ORDER — SODIUM CHLORIDE 0.9% FLUSH
3.0000 mL | Freq: Two times a day (BID) | INTRAVENOUS | Status: DC
  Administered 2017-12-06 – 2017-12-09 (×3): 3 mL via INTRAVENOUS

## 2017-12-06 MED ORDER — ACETAMINOPHEN 325 MG PO TABS
650.0000 mg | ORAL_TABLET | Freq: Four times a day (QID) | ORAL | Status: DC | PRN
  Filled 2017-12-06: qty 2

## 2017-12-06 MED ORDER — SODIUM CHLORIDE 0.9 % IV SOLN
1.0000 g | Freq: Once | INTRAVENOUS | Status: AC
  Administered 2017-12-06: 1 g via INTRAVENOUS
  Filled 2017-12-06: qty 1

## 2017-12-06 MED ORDER — MONTELUKAST SODIUM 10 MG PO TABS
10.0000 mg | ORAL_TABLET | Freq: Every day | ORAL | Status: DC
  Administered 2017-12-06 – 2017-12-09 (×4): 10 mg via ORAL
  Filled 2017-12-06 (×5): qty 1

## 2017-12-06 MED ORDER — SODIUM CHLORIDE 0.9 % IV SOLN
INTRAVENOUS | Status: AC
  Administered 2017-12-06: 1000 mL via INTRAVENOUS

## 2017-12-06 MED ORDER — SODIUM CHLORIDE 0.9 % IV SOLN
1.0000 g | Freq: Three times a day (TID) | INTRAVENOUS | Status: DC
  Administered 2017-12-06 – 2017-12-10 (×13): 1 g via INTRAVENOUS
  Filled 2017-12-06 (×14): qty 1

## 2017-12-06 MED ORDER — ONDANSETRON HCL 4 MG/2ML IJ SOLN
4.0000 mg | Freq: Four times a day (QID) | INTRAMUSCULAR | Status: DC | PRN
  Administered 2017-12-06: 4 mg via INTRAVENOUS
  Filled 2017-12-06: qty 2

## 2017-12-06 MED ORDER — ACETAMINOPHEN 650 MG RE SUPP: 650.0000 mg | Freq: Four times a day (QID) | RECTAL | Status: DC | PRN

## 2017-12-06 MED ORDER — HYDROCODONE-ACETAMINOPHEN 5-325 MG PO TABS: 1.0000 | ORAL_TABLET | ORAL | Status: DC | PRN

## 2017-12-06 MED ORDER — ALBUTEROL SULFATE (2.5 MG/3ML) 0.083% IN NEBU: 2.5000 mg | INHALATION_SOLUTION | RESPIRATORY_TRACT | Status: DC | PRN

## 2017-12-06 MED ORDER — BUDESONIDE 0.5 MG/2ML IN SUSP
0.5000 mg | Freq: Two times a day (BID) | RESPIRATORY_TRACT | Status: DC
  Administered 2017-12-06 – 2017-12-10 (×8): 0.5 mg via RESPIRATORY_TRACT
  Filled 2017-12-06 (×10): qty 2

## 2017-12-06 MED ORDER — PANTOPRAZOLE SODIUM 40 MG PO TBEC
40.0000 mg | DELAYED_RELEASE_TABLET | Freq: Every day | ORAL | Status: DC
  Administered 2017-12-07 – 2017-12-10 (×4): 40 mg via ORAL
  Filled 2017-12-06 (×4): qty 1

## 2017-12-06 MED ORDER — FILGRASTIM 480 MCG/1.6ML IJ SOLN
480.0000 ug | Freq: Once | INTRAMUSCULAR | Status: AC
  Administered 2017-12-06: 480 ug via SUBCUTANEOUS
  Filled 2017-12-06: qty 1.6

## 2017-12-06 MED ORDER — ONDANSETRON HCL 4 MG PO TABS: 4.0000 mg | ORAL_TABLET | Freq: Four times a day (QID) | ORAL | Status: DC | PRN

## 2017-12-06 NOTE — Progress Notes (Signed)
Pharmacy Antibiotic Note  Donnielle Flaugher is a 62 y.o. female admitted on 12/05/2017 with febrile neutropenia.  Pharmacy has been consulted to change ABX to [place].  Plan: Merrem 1g IV Q8H.  Height: 5\' 8"  (172.7 cm) Weight: 143 lb 4.8 oz (65 kg) IBW/kg (Calculated) : 63.9  Temp (24hrs), Avg:100.2 F (37.9 C), Min:99.1 F (37.3 C), Max:101 F (38.3 C)  Recent Labs  Lab 12/05/17 1834 12/05/17 1843 12/05/17 2247  WBC 1.6*  --   --   CREATININE 0.96  --   --   LATICACIDVEN  --  1.22 1.52    Estimated Creatinine Clearance: 62.1 mL/min (by C-G formula based on SCr of 0.96 mg/dL).    Allergies  Allergen Reactions  . Amoxicillin   . Clarithromycin   . Clindamycin/Lincomycin   . Erythromycin Nausea And Vomiting  . Tape      Thank you for allowing pharmacy to be a part of this patient's care.  Wynona Neat, PharmD, BCPS  12/06/2017 1:12 AM

## 2017-12-06 NOTE — Care Management Note (Addendum)
Case Management Note  Patient Details  Name: Kelli Crane MRN: [id number] Date of Birth: 11/29/1955  Subjective/Objective:  History of asthma, history of breast cancer status post chemotherapy and CLL (chronic lymphocytic leukemia).  Admitted for Neutropenic fever.  Action/Plan: Patient is visiting [place] from her home in [place] where she is undergoing chemotherapy for CLL(chronic lymphocytic leukemia).  Patient's oncologist in [place], his name is Dr. Jeannie Fend.  NCM will continue to monitor patient for any discharge transition needs.  Expected Discharge Date:  12/08/17               Expected Discharge Plan:  Home/Self Care  In-House Referral:     Discharge planning Services  CM Consult  Status of Service:  In process, will continue to follow  If discussed at Long Length of Stay Meetings, dates discussed:    Additional Comments:  Kristen Cardinal, RN  Nurse Case Manager [place] 12/06/2017, 3:01 PM

## 2017-12-06 NOTE — Progress Notes (Signed)
   Follow Up Note  HPI: Please refer to H&P done earlier this morning  Briefly this is a 62 year old female with medical history significant for asthma, history of breast cancer status post chemotherapy and CLL currently on chemotherapy presents to the ED complaining of malaise, and nausea.  The ED found to be febrile with neutropenia.  Patient is currently on chemotherapy and also takes Neulasta.  Patient is visiting her children from [place].  Neutropenic fever workup in progress, so far chest x-ray no acute findings, CT sinuses showed chronic sinusitis, respiratory viral panel negative.  Urine culture and blood culture pending.  Last CBC done showed 0 neutrophil count.  Patient's vital signs otherwise stable, last temperature spike 100.1.  Patient started on meropenem.  Patient denies any new complaints, no diarrhea, abdominal pain, dysuria, cough, nausea/vomiting.  Spoke to patient's oncologist in [place], his name is Dr. Jeannie Fend and his office phone number is [phone].  Dr. Mcarthur Rossetti recommended given patient Neupogen and monitoring closely for further signs of infection.  Exam: CV: S1-S2 present Lungs: Chest clear to auscultation bilaterally Abd: Soft, nontender, nondistended, bowel sounds present Ext: No pedal edema bilaterally  Present on Admission: . Neutropenic fever ([place]) . Asthma . Hypokalemia . Pancytopenia ([place]) . CLL (chronic lymphocytic leukemia) ([place]) . Breast cancer ([place])   Disposition:  We will give patient 1 dose of Neupogen today Follow-up blood culture, urine culture Continue meropenem If clinical status declines, consider ID consult

## 2017-12-06 NOTE — H&P (Signed)
History and Physical    Kelli Crane SEG:[id number] DOB: [birth year] DOA: 12/05/2017  PCP: Patient, No Pcp Per   Patient coming from:  Home, by way of [facility]   Chief Complaint: Fever, nausea, malaise   HPI: Kelli Crane is a 62 y.o. female with medical history significant for asthma, history of breast cancer treated with chemotherapy, and CLL managed with chemotherapy in her home state of [place], now presenting to the emergency department for evaluation of fever, malaise, and nausea.  Patient is visiting [place] from her home in [place] where she is undergoing chemotherapy for CLL, and has now developed fevers with general malaise and nausea without vomiting.  She reports some sinus congestion that is chronic, denies cough or shortness of breath, denies abdominal pain, vomiting, or diarrhea, and denies any headache or neck pain.  No rash or wound.  Reports that she is scheduled for Neulasta injection on 12/09/2017.  [facility] ED Course: Upon arrival to the ED, patient is found to be febrile to 38.2 C, tachycardic in the 110s, and vitals otherwise normal.  EKG features a sinus tachycardia with rate 106 and chest x-rays negative for acute cardiopulmonary disease.  Maxillofacial CT findings are consistent with chronic sinusitis.  Chemistry panel reveals a mild hypokalemia and CBC is notable for WBC 1600, hemoglobin 10.8, and platelets 133,000.  Lactic acid is reassuringly normal.  Blood and urine cultures were collected, respiratory virus panel was sent, and the patient was treated with empiric vancomycin, Levaquin, and aztreonam.  She was also given 40 mEq oral potassium in the ED.  She remains hemodynamically stable, in no apparent respiratory distress, and will be admitted to the telemetry unit for ongoing evaluation and management of neutropenic fever.  Review of Systems:  All other systems reviewed and apart from HPI, are negative.  Past Medical History:  Diagnosis Date    . Asthma   . Cancer [facility])     Past Surgical History:  Procedure Laterality Date  . BREAST SURGERY    . MASTECTOMY    . TONSILLECTOMY       reports that she has never smoked. She has never used smokeless tobacco. She reports that she does not drink alcohol or use drugs.  Allergies  Allergen Reactions  . Amoxicillin   . Clarithromycin   . Clindamycin/Lincomycin   . Erythromycin Nausea And Vomiting  . Tape     Family History  Problem Relation Age of Onset  . Hypertension Mother   . Depression Mother   . Cancer Mother   . Cancer Sister      Prior to Admission medications   Medication Sig Start Date End Date Taking? Authorizing Provider  albuterol (PROVENTIL,VENTOLIN) 2 MG/5ML syrup Take 2 mg by mouth 3 (three) times daily.   Yes [provider]  mometasone (ASMANEX) 220 MCG/INH inhaler Inhale 2 puffs into the lungs daily.   Yes [provider]  pseudoephedrine (SUDAFED) 120 MG 12 hr tablet Take 120 mg by mouth 2 (two) times daily.   Yes [provider]  venetoclax (VENCLEXTA) 100 MG TABS Take 400 mg by mouth daily.   Yes [provider]  cetirizine (ZYRTEC) 10 MG tablet Take 10 mg by mouth daily.    [provider]  Ibrutinib 420 MG TABS Take by mouth.    [provider]  lansoprazole (PREVACID) 30 MG capsule Take 30 mg by mouth daily at 12 noon.    [provider]  mometasone (NASONEX)  50 MCG/ACT nasal spray Place 2 sprays into the nose daily.    [provider]  montelukast (SINGULAIR) 10 MG tablet Take 10 mg by mouth at bedtime.    [provider]  mupirocin cream (BACTROBAN) 2 % Apply 1 application topically 3 (three) times daily. 01/21/17   Kandra Nicolas, MD    Physical Exam: Vitals:   12/05/17 2130 12/05/17 2240 12/05/17 2312 12/06/17 0020  BP: 112/63  106/68 (!) 133/55  Pulse: (!) 113  94 (!) 114  Resp: 12  16   Temp:  99.8 F (37.7 C)  (!) 100.7 F (38.2 C)  TempSrc:    Oral   SpO2: 91%  100% 100%  Weight:    65 kg (143 lb 4.8 oz)  Height:    5\' 8"  (1.727 m)      Constitutional: NAD, calm  Eyes: PERTLA, lids and conjunctivae normal ENMT: Mucous membranes are moist. Posterior pharynx clear of any exudate or lesions.   Neck: normal, supple, no masses, no thyromegaly Respiratory: clear to auscultation bilaterally, no wheezing, no crackles. Normal respiratory effort.    Cardiovascular: Rate ~110 and regular. No significant JVD. Abdomen: No distension, no tenderness, soft. Bowel sounds normal.  Musculoskeletal: no clubbing / cyanosis. No joint deformity upper and lower extremities.   Skin: no significant rashes, lesions, ulcers. Warm, dry, well-perfused. Neurologic: CN 2-12 grossly intact. Sensation intact. Strength 5/5 in all 4 limbs.  Psychiatric: Alert and oriented x 3. Calm, cooperative.     Labs on Admission: I have personally reviewed following labs and imaging studies  CBC: Recent Labs  Lab 12/05/17 1834  WBC 1.6*  NEUTROABS 0.1*  HGB 10.8*  HCT 29.8*  MCV 83.5  PLT 703*   Basic Metabolic Panel: Recent Labs  Lab 12/05/17 1834  NA 136  K 3.4*  CL 103  CO2 22  GLUCOSE 123*  BUN 14  CREATININE 0.96  CALCIUM 9.1   GFR: Estimated Creatinine Clearance: 62.1 mL/min (by C-G formula based on SCr of 0.96 mg/dL). Liver Function Tests: Recent Labs  Lab 12/05/17 1834  AST 20  ALT 16  ALKPHOS 79  BILITOT 0.7  PROT 6.9  ALBUMIN 4.3   No results for input(s): LIPASE, AMYLASE in the last 168 hours. No results for input(s): AMMONIA in the last 168 hours. Coagulation Profile: Recent Labs  Lab 12/05/17 1834  INR 1.00   Cardiac Enzymes: No results for input(s): CKTOTAL, CKMB, CKMBINDEX, TROPONINI in the last 168 hours. BNP (last 3 results) No results for input(s): PROBNP in the last 8760 hours. HbA1C: No results for input(s): HGBA1C in the last 72 hours. CBG: No results for input(s): GLUCAP in the last 168 hours. Lipid  Profile: No results for input(s): CHOL, HDL, LDLCALC, TRIG, CHOLHDL, LDLDIRECT in the last 72 hours. Thyroid Function Tests: No results for input(s): TSH, T4TOTAL, FREET4, T3FREE, THYROIDAB in the last 72 hours. Anemia Panel: No results for input(s): VITAMINB12, FOLATE, FERRITIN, TIBC, IRON, RETICCTPCT in the last 72 hours. Urine analysis:    Component Value Date/Time   COLORURINE YELLOW 12/05/2017 1807   APPEARANCEUR CLEAR 12/05/2017 1807   LABSPEC 1.010 12/05/2017 1807   PHURINE 6.5 12/05/2017 1807   GLUCOSEU NEGATIVE 12/05/2017 1807   HGBUR TRACE (A) 12/05/2017 1807   BILIRUBINUR NEGATIVE 12/05/2017 1807   KETONESUR NEGATIVE 12/05/2017 1807   PROTEINUR NEGATIVE 12/05/2017 1807   UROBILINOGEN 0.2 10/16/2012 2116   NITRITE NEGATIVE 12/05/2017 1807   LEUKOCYTESUR MODERATE (A) 12/05/2017 1807   Sepsis  Labs: @LABRCNTIP (procalcitonin:4,lacticidven:4) )No results found for this or any previous visit (from the past 240 hour(s)).   Radiological Exams on Admission: Dg Chest 2 View  Result Date: 12/05/2017 CLINICAL DATA:  Fever, vertigo. EXAM: CHEST - 2 VIEW COMPARISON:  None. FINDINGS: The heart size and mediastinal contours are within normal limits. Both lungs are clear. No pneumothorax or pleural effusion is noted. The visualized skeletal structures are unremarkable. IMPRESSION: No active cardiopulmonary disease. Electronically Signed   By: Marijo Conception, M.D.   On: 12/05/2017 19:02   Ct Maxillofacial Wo Contrast  Result Date: 12/05/2017 CLINICAL DATA:  Severe frontal headache x3 days with pain behind the maxillary sinuses. Fever. History of breast cancer and asthma. Suspect sinusitis. EXAM: CT MAXILLOFACIAL WITHOUT CONTRAST TECHNIQUE: Multidetector CT imaging of the maxillofacial structures was performed. Multiplanar CT image reconstructions were also generated. COMPARISON:  None. FINDINGS: Osseous: No fracture or mandibular dislocation. No destructive process. Mild dextroconvex  curvature of the nasal septum. Orbits: Negative. No traumatic or inflammatory finding. Sinuses: Moderate circumferential mucosal thickening of the left maxillary sinus without air-fluid levels. Underdeveloped frontal sinus. Right maxillary, ethmoid and sphenoid sinuses are nonacute. Soft tissues: Unremarkable Limited intracranial: Nonacute IMPRESSION: Moderate circumferential left maxillary sinus mucosal thickening consistent with chronic sinusitis. Electronically Signed   By: Ashley Royalty M.D.   On: 12/05/2017 19:13    EKG: Independently reviewed. Sinus tachycardia (rate 106).   Assessment/Plan   1. Neutropenic fever  - Presents with fever, nausea, chronic sinus congestion, and general malaise  - Found to be febrile with WBC 1,600 and ANC is 64  - CXR is unremarkable, there is asymptomatic bacteruria, no meningismus, no abd pain or diarrhea, no wounds  - Blood and urine cultures collected in ED and she was treated with aztreonam, Levaquin, and vancomycin  - Respiratory virus panel was sent from ED  - Plan to follow cultures and viral panel, and continue empiric abx with meropenem monotherapy    2. CLL; pancytopenia   - Pt is undergoing chemotherapy in her home state of [place] for CLL, scheduled for Neulasta on 12/09/17  - She is pancytopenic with WBC 1.6k, Hgb of 10.8, and platelets 133k; no bleeding, likely secondary to chemotherapy  - Repeat CBC in am    3. Hypokalemia  - Serum potassium is 3.4 on presentation  - Treated in ED with 40 mEq oral potassium  - Repeat chem panel in am    4. Asthma - No wheezing or SOB on admission  - Continue scheduled ICS and prn albuterol    DVT prophylaxis: SCD's  Code Status: Full  Family Communication: Discussed with patient Consults called: None Admission status: Inpatient    Vianne Bulls, MD [facility] Pager [phone]  If 7PM-7AM, please contact night-coverage [email or web address] Password [email or web address]  12/06/2017, 1:06 AM

## 2017-12-07 DIAGNOSIS — J4530 Mild persistent asthma, uncomplicated: Secondary | ICD-10-CM

## 2017-12-07 DIAGNOSIS — R5081 Fever presenting with conditions classified elsewhere: Secondary | ICD-10-CM

## 2017-12-07 DIAGNOSIS — D709 Neutropenia, unspecified: Principal | ICD-10-CM

## 2017-12-07 DIAGNOSIS — C9190 Lymphoid leukemia, unspecified not having achieved remission: Secondary | ICD-10-CM

## 2017-12-07 LAB — CBC WITH DIFFERENTIAL/PLATELET
BAND NEUTROPHILS: 3 %
BASOS ABS: 0 10*3/uL (ref 0.0–0.1)
BASOS PCT: 0 %
Blasts: 0 %
EOS ABS: 0 10*3/uL (ref 0.0–0.7)
Eosinophils Relative: 0 %
HCT: 29.1 % — ABNORMAL LOW (ref 36.0–46.0)
Hemoglobin: 9.8 g/dL — ABNORMAL LOW (ref 12.0–15.0)
Lymphocytes Relative: 69 %
Lymphs Abs: 1.3 10*3/uL (ref 0.7–4.0)
MCH: 28 pg (ref 26.0–34.0)
MCHC: 33.7 g/dL (ref 30.0–36.0)
MCV: 83.1 fL (ref 78.0–100.0)
METAMYELOCYTES PCT: 0 %
MONO ABS: 0.5 10*3/uL (ref 0.1–1.0)
Monocytes Relative: 26 %
Myelocytes: 0 %
NEUTROS ABS: 0.1 10*3/uL — AB (ref 1.7–7.7)
Neutrophils Relative %: 2 %
Other: 0 %
PLATELETS: 99 10*3/uL — AB (ref 150–400)
Promyelocytes Relative: 0 %
RBC: 3.5 MIL/uL — ABNORMAL LOW (ref 3.87–5.11)
RDW: 14.2 % (ref 11.5–15.5)
WBC: 1.9 10*3/uL — ABNORMAL LOW (ref 4.0–10.5)
nRBC: 0 /100 WBC

## 2017-12-07 LAB — BASIC METABOLIC PANEL
Anion gap: 12 (ref 5–15)
BUN: 7 mg/dL (ref 6–20)
CALCIUM: 8.8 mg/dL — AB (ref 8.9–10.3)
CO2: 21 mmol/L — ABNORMAL LOW (ref 22–32)
CREATININE: 0.9 mg/dL (ref 0.44–1.00)
Chloride: 102 mmol/L (ref 101–111)
Glucose, Bld: 99 mg/dL (ref 65–99)
Potassium: 3.7 mmol/L (ref 3.5–5.1)
SODIUM: 135 mmol/L (ref 135–145)

## 2017-12-07 LAB — URINE CULTURE: Culture: <10000 — AB

## 2017-12-07 LAB — MAGNESIUM: Magnesium: 2 mg/dL (ref 1.7–2.4)

## 2017-12-07 MED ORDER — DEXTROSE IN LACTATED RINGERS 5 % IV SOLN
INTRAVENOUS | Status: DC
  Administered 2017-12-07 – 2017-12-09 (×3): via INTRAVENOUS

## 2017-12-07 NOTE — Progress Notes (Signed)
PROGRESS NOTE    Kelli Crane  ZDG:[id number] DOB: 04-11-1956 DOA: 12/05/2017 PCP: Patient, No Pcp Per    Brief Narrative:  62 year old female who presented with fever, nausea and malaise.  She does have significant past medical history for CLL undergoing chemotherapy, (last infusion 2 weeks ago), and asthma.  She developed fevers which were persistent, associated with malaise and nausea.  No cough, no diarrhea, no dysuria.  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  Initial physical examination blood pressure 112/63, heart rate 113, respiratory 12, oxygen saturation 91-100%, temperature 100.7.  Moist mucous membranes, lungs clear to auscultation bilaterally, no wheezing, rales rhonchi, heart S1-S2 present and rhythmic, no gallops, rubs or murmurs, the abdomen was soft nontender, no lower extremity edema.  Patient was admitted to the hospital with the working diagnosis of neutropenic fever.   Assessment & Plan:   Principal Problem:   Neutropenic fever ([place]) Active Problems:   Asthma   Hypokalemia   Pancytopenia ([facility])   CLL (chronic lymphocytic leukemia) ([facility])   Breast cancer ([place])   1.  Neutropenic fever. Continue prophylactic antibiotic therapy with meropenem, patient allergic to amoxicillin, no signs of infection, will continue close monitoring. Patient has received colony stimulating factors. Today wbc at 1,9 with 0.1 % neutrophils.   2. Asthma. Stable with no signs of exacerbation, continue budesonide, and albuterol.   3. Anemia and thrombocytopenia. Likely related to chemotherapy, will continue close follow up.   4. GERD. Continue pantoprazole.    DVT prophylaxis:scd  Code Status: full Family Communication: no family at the bedside Disposition Plan: home when more stable   Consultants:     Procedures:     Antimicrobials:       Subjective: Patient is feeling better, but not close to her baseline, no nausea or vomiting, poor oral intake.    Objective: Vitals:   12/06/17 2229 12/07/17 0547 12/07/17 0751 12/07/17 1017  BP:  (!) 102/54 (!) 102/55   Pulse:  89 99   Resp:  18 18   Temp:  98.5 F (36.9 C) 99.5 F (37.5 C)   TempSrc:  Oral Oral   SpO2: 100% 100% 97% 97%  Weight:      Height:        Intake/Output Summary (Last 24 hours) at 12/07/2017 1412 Last data filed at 12/07/2017 1356 Gross per 24 hour  Intake 580 ml  Output 625 ml  Net -45 ml   Filed Weights   12/05/17 1805 12/06/17 0020 12/06/17 2028  Weight: 65.8 kg (145 lb) 65 kg (143 lb 4.8 oz) 65 kg (143 lb 4.9 oz)    Examination:   General: Not in pain or dyspnea, deconditioned  Neurology: Awake and alert, non focal  E ENT: mild pallor, no icterus, oral mucosa moist Cardiovascular: No JVD. S1-S2 present, rhythmic, no gallops, rubs, or murmurs. No lower extremity edema. Pulmonary: vesicular breath sounds bilaterally, adequate air movement, no wheezing, rhonchi or rales. Gastrointestinal. Abdomen flat, no organomegaly, non tender, no rebound or guarding Skin. No rashes Musculoskeletal: no joint deformities     Data Reviewed: I have personally reviewed following labs and imaging studies  CBC: Recent Labs  Lab 12/05/17 1834 12/06/17 0643 12/07/17 0453  WBC 1.6* 1.7* 1.9*  NEUTROABS 0.1* 0.0* 0.1*  HGB 10.8* 9.2* 9.8*  HCT 29.8* 26.3* 29.1*  MCV 83.5 83.0 83.1  PLT 133* 108* 99*   Basic Metabolic Panel: Recent Labs  Lab 12/05/17 1834 12/06/17 0643 12/07/17 0453  NA 136 138 135  K 3.4* 3.4* 3.7  CL 103 112* 102  CO2 22 18* 21*  GLUCOSE 123* 99 99  BUN 14 9 7   CREATININE 0.96 0.88 0.90  CALCIUM 9.1 7.8* 8.8*  MG  --  1.4* 2.0   GFR: Estimated Creatinine Clearance: 66.2 mL/min (by C-G formula based on SCr of 0.9 mg/dL). Liver Function Tests: Recent Labs  Lab 12/05/17 1834  AST 20  ALT 16  ALKPHOS 79  BILITOT 0.7  PROT 6.9  ALBUMIN 4.3   No results for input(s): LIPASE, AMYLASE in the last 168 hours. No results for  input(s): AMMONIA in the last 168 hours. Coagulation Profile: Recent Labs  Lab 12/05/17 1834  INR 1.00   Cardiac Enzymes: No results for input(s): CKTOTAL, CKMB, CKMBINDEX, TROPONINI in the last 168 hours. BNP (last 3 results) No results for input(s): PROBNP in the last 8760 hours. HbA1C: No results for input(s): HGBA1C in the last 72 hours. CBG: No results for input(s): GLUCAP in the last 168 hours. Lipid Profile: No results for input(s): CHOL, HDL, LDLCALC, TRIG, CHOLHDL, LDLDIRECT in the last 72 hours. Thyroid Function Tests: No results for input(s): TSH, T4TOTAL, FREET4, T3FREE, THYROIDAB in the last 72 hours. Anemia Panel: No results for input(s): VITAMINB12, FOLATE, FERRITIN, TIBC, IRON, RETICCTPCT in the last 72 hours.    Radiology Studies: I have reviewed all of the imaging during this hospital visit personally     Scheduled Meds: . budesonide  0.5 mg Nebulization BID  . montelukast  10 mg Oral QHS  . pantoprazole  40 mg Oral Daily  . sodium chloride flush  3 mL Intravenous Q12H   Continuous Infusions: . meropenem (MERREM) IV Stopped (12/07/17 1137)     LOS: 2 days        Mauricio Gerome Apley, MD [facility] Pager [phone]

## 2017-12-07 NOTE — Progress Notes (Signed)
PROGRESS NOTE    Kelli Crane  SNK:[id number] DOB: 05/03/1956 DOA: 12/05/2017 PCP: Patient, No Pcp Per    Brief Narrative:  Kelli Crane is a 62 y.o. female who presented to the ED on 4/8 for fever, general weakness and nausea without vomiting. Past medical history is significant for asthma, breast cancer treated with chemotherapy, CLL currently on chemotherapy, last treatment two weeks ago, in her home state of [place]. She presented to [facility] ED and was then transferred to [facility] ED, vitals found to be 99.1 F, blood pressure 108/68 mmHg, pulse 117, respirations 20 and oxygen saturation 100% on room air. Sodium 138, Potassium 3.4, Chloride 112, CO2, 18, Glucose 99, BUN 9, Creatinine 0.88, AG 8, WBC 1.7, RBC 3.17, Hgb 9.2, MCV 83.0, Platelets 108, Neutrophils 1, Lymphocytes 70.  CT Maxillofacial consistent with chronic sinusitis. CXR unremarkable. EKG shows sinus tachycardia with borderline right axis deviation, otherwise normal.  Admitted for workup of neutropenic fever with immunocompromised status.  Assessment & Plan:   Principal Problem:   Neutropenic fever ([place]) Active Problems:   Asthma   Hypokalemia   Pancytopenia (HCC)   CLL (chronic lymphocytic leukemia) (HCC)   Breast cancer (HCC)   Neutropenic fever -Patient experiencing subjective fever at 99.5 F, last spike 4/8 at 2000 hours 101 F. -Respiratory panel negative, blood culture show no growth after 24 hours. -UA negative. -Continue antibiotic therapy with meropenem. -Monitor for signs of infection.  CLL, Pancytopenia -Patient currently undergoing chemotherapy in home state of [place], scheduled for Neulasta on 12/09/2017, reports she receives this medication every third Friday, has spoken to her oncologist in [place] and plans to receive upon returning home, hopefully Monday 12/12/2017. -As per oncologist in [place], Dr. Jeannie Fend, office phone [phone], 1 dose of Neupogen 480 mcg given 12/06/2017 @ 1236. -WBC 1.9,  RBC 3.50, Hgb 9.8, MCV 83.1, platelets 99. -Neutrophils improved at 2, lymphocytes 69. -Continue to monitor labs, recheck CBC.  Hypokalemia -Improved, today at 3.7, up from 3.4 on admission. -Continue to monitor labs, recheck CMP.  Asthma -Experiencing mild cough, denies wheezing and SOB. -Continue ICS and PRN albuterol.  DVT prophylaxis: SCD's. Code Status: FULL Family Communication: None  Disposition Plan: Inpatient, pending clinical improvement.   Consultants:   None  Procedures:   None  Antimicrobials:  Meropenem 1g in sodium chloride 0.9% 100 mL IVPB, started 12/06/17 - ongoing.  Vancomycin 750 mg  IVPB started 12/06/17 0700 - discontinued 12/06/17 0103.  Levofloxacin 750 mg IVPB started 12/06/17 1900 - discontinued 12/06/17 0103.  Aztreonam 2g IVPB started 12/06/17 0400 - discontinued 12/06/17 0103.  Subjective: Patient is awake, alert and oriented x 3, calm and interactive. She reports feeling malaise but improving,  RUQ abdominal pain, mild motion sickness and constipation. Denies chest pain, shortness of breath, nausea, vomiting, headache. Denies dysuria, hematuria, hematochezia. Denies bleed.  Objective: Vitals:   12/06/17 2028 12/06/17 2229 12/07/17 0547 12/07/17 0751  BP: (!) 103/58  (!) 102/54 (!) 102/55  Pulse: 98  89 99  Resp: 19  18 18   Temp: 99.9 F (37.7 C)  98.5 F (36.9 C) 99.5 F (37.5 C)  TempSrc: Oral  Oral Oral  SpO2: 95% 100% 100% 97%  Weight: 65 kg (143 lb 4.9 oz)     Height:        Intake/Output Summary (Last 24 hours) at 12/07/2017 0935 Last data filed at 12/07/2017 0200 Gross per 24 hour  Intake 810 ml  Output 325 ml  Net 485 ml  Filed Weights   12/05/17 1805 12/06/17 0020 12/06/17 2028  Weight: 65.8 kg (145 lb) 65 kg (143 lb 4.8 oz) 65 kg (143 lb 4.9 oz)    Examination:  General exam: Appears calm and comfortable  Respiratory system: Clear to auscultation. Respiratory effort normal. Cardiovascular system: S1 & S2  heard, RRR. No murmurs, rubs, gallops or clicks. No pedal edema. Gastrointestinal system: Abdomen is tender to palpation in the RUQ. Normal bowel sounds heard. Central nervous system: Alert and oriented. No focal neurological deficits. Extremities: Moves all four extremities. Skin: No rashes, lesions or ulcers Psychiatry: Judgement and insight appear normal. Mood & affect appropriate.    Data Reviewed: I have personally reviewed following labs and imaging studies  CBC: Recent Labs  Lab 12/05/17 1834 12/06/17 0643 12/07/17 0453  WBC 1.6* 1.7* 1.9*  NEUTROABS 0.1* 0.0* 0.1*  HGB 10.8* 9.2* 9.8*  HCT 29.8* 26.3* 29.1*  MCV 83.5 83.0 83.1  PLT 133* 108* 99*   Basic Metabolic Panel: Recent Labs  Lab 12/05/17 1834 12/06/17 0643 12/07/17 0453  NA 136 138 135  K 3.4* 3.4* 3.7  CL 103 112* 102  CO2 22 18* 21*  GLUCOSE 123* 99 99  BUN 14 9 7   CREATININE 0.96 0.88 0.90  CALCIUM 9.1 7.8* 8.8*  MG  --  1.4* 2.0   GFR: Estimated Creatinine Clearance: 66.2 mL/min (by C-G formula based on SCr of 0.9 mg/dL). Liver Function Tests: Recent Labs  Lab 12/05/17 1834  AST 20  ALT 16  ALKPHOS 79  BILITOT 0.7  PROT 6.9  ALBUMIN 4.3   No results for input(s): LIPASE, AMYLASE in the last 168 hours. No results for input(s): AMMONIA in the last 168 hours. Coagulation Profile: Recent Labs  Lab 12/05/17 1834  INR 1.00   Cardiac Enzymes: No results for input(s): CKTOTAL, CKMB, CKMBINDEX, TROPONINI in the last 168 hours. BNP (last 3 results) No results for input(s): PROBNP in the last 8760 hours. HbA1C: No results for input(s): HGBA1C in the last 72 hours. CBG: No results for input(s): GLUCAP in the last 168 hours. Lipid Profile: No results for input(s): CHOL, HDL, LDLCALC, TRIG, CHOLHDL, LDLDIRECT in the last 72 hours. Thyroid Function Tests: No results for input(s): TSH, T4TOTAL, FREET4, T3FREE, THYROIDAB in the last 72 hours. Anemia Panel: No results for input(s):  VITAMINB12, FOLATE, FERRITIN, TIBC, IRON, RETICCTPCT in the last 72 hours. Sepsis Labs: Recent Labs  Lab 12/05/17 1843 12/05/17 2247  LATICACIDVEN 1.22 1.52    Recent Results (from the past 240 hour(s))  Culture, blood (Routine x 2)     Status: None (Preliminary result)   Collection Time: 12/05/17  6:30 PM  Result Value Ref Range Status   Specimen Description   Final    BLOOD RIGHT ANTECUBITAL Performed at [facility], [address] [place], [place] [address]    Special Requests   Final    BOTTLES DRAWN AEROBIC AND ANAEROBIC Blood Culture adequate volume Performed at [facility], [address] [place], [place] [address]    Culture   Final    NO GROWTH < 24 HOURS Performed at [facility], [place] [address], [place],  [address]    Report Status PENDING  Incomplete  Urine culture     Status: Abnormal   Collection Time: 12/05/17  8:33 PM  Result Value Ref Range Status   Specimen Description   Final    URINE, CLEAN CATCH Performed at [facility]  [address], [place], [place] [address]    Special Requests   Final    Immunocompromised Performed at [facility], [address] [place], [place] [address]    Culture (A)  Final    <10,000 COLONIES/mL Performed at [facility], [place] [address] [place], [place] [address]    Report Status 12/07/2017 FINAL  Final  Respiratory Panel by PCR     Status: None   Collection Time: 12/05/17 10:45 PM  Result Value Ref Range Status   Adenovirus NOT DETECTED NOT DETECTED Final   Coronavirus 229E NOT DETECTED NOT DETECTED Final   Coronavirus HKU1 NOT DETECTED NOT DETECTED Final   Coronavirus NL63 NOT DETECTED NOT DETECTED Final   Coronavirus OC43 NOT DETECTED NOT DETECTED Final   Metapneumovirus NOT DETECTED NOT DETECTED Final   Rhinovirus / Enterovirus NOT DETECTED NOT DETECTED Final   Influenza A NOT DETECTED NOT DETECTED Final   Influenza B NOT DETECTED NOT DETECTED  Final   Parainfluenza Virus 1 NOT DETECTED NOT DETECTED Final   Parainfluenza Virus 2 NOT DETECTED NOT DETECTED Final   Parainfluenza Virus 3 NOT DETECTED NOT DETECTED Final   Parainfluenza Virus 4 NOT DETECTED NOT DETECTED Final   Respiratory Syncytial Virus NOT DETECTED NOT DETECTED Final   Bordetella pertussis NOT DETECTED NOT DETECTED Final   Chlamydophila pneumoniae NOT DETECTED NOT DETECTED Final   Mycoplasma pneumoniae NOT DETECTED NOT DETECTED Final    Comment: Performed at [facility], [place] [address], [place],  [address]     Radiology Studies: Dg Chest 2 View  Result Date: 12/05/2017 CLINICAL DATA:  Fever, vertigo. EXAM: CHEST - 2 VIEW COMPARISON:  None. FINDINGS: The heart size and mediastinal contours are within normal limits. Both lungs are clear. No pneumothorax or pleural effusion is noted. The visualized skeletal structures are unremarkable. IMPRESSION: No active cardiopulmonary disease. Electronically Signed   By: Marijo Conception, M.D.   On: 12/05/2017 19:02   Ct Maxillofacial Wo Contrast  Result Date: 12/05/2017 CLINICAL DATA:  Severe frontal headache x3 days with pain behind the maxillary sinuses. Fever. History of breast cancer and asthma. Suspect sinusitis. EXAM: CT MAXILLOFACIAL WITHOUT CONTRAST TECHNIQUE: Multidetector CT imaging of the maxillofacial structures was performed. Multiplanar CT image reconstructions were also generated. COMPARISON:  None. FINDINGS: Osseous: No fracture or mandibular dislocation. No destructive process. Mild dextroconvex curvature of the nasal septum. Orbits: Negative. No traumatic or inflammatory finding. Sinuses: Moderate circumferential mucosal thickening of the left maxillary sinus without air-fluid levels. Underdeveloped frontal sinus. Right maxillary, ethmoid and sphenoid sinuses are nonacute. Soft tissues: Unremarkable Limited intracranial: Nonacute IMPRESSION: Moderate circumferential left maxillary sinus mucosal thickening  consistent with chronic sinusitis. Electronically Signed   By: Ashley Royalty M.D.   On: 12/05/2017 19:13   Scheduled Meds: . budesonide  0.5 mg Nebulization BID  . montelukast  10 mg Oral QHS  . pantoprazole  40 mg Oral Daily  . sodium chloride flush  3 mL Intravenous Q12H   Continuous Infusions: . meropenem (MERREM) IV 1 g (12/07/17 0733)    LOS: 2 days   Time spent: 25 minutes.  Eloy End, PA-S [facility] Pager [phone]  If 7PM-7AM, please contact night-coverage [email or web address] Password [email or web address] 12/07/2017, 9:35 AM

## 2017-12-08 DIAGNOSIS — E876 Hypokalemia: Secondary | ICD-10-CM

## 2017-12-08 DIAGNOSIS — D61818 Other pancytopenia: Secondary | ICD-10-CM

## 2017-12-08 LAB — CBC WITH DIFFERENTIAL/PLATELET
Basophils Absolute: 0 10*3/uL (ref 0.0–0.1)
Basophils Relative: 1 %
Eosinophils Absolute: 0 10*3/uL (ref 0.0–0.7)
Eosinophils Relative: 0 %
HCT: 30.4 % — ABNORMAL LOW (ref 36.0–46.0)
HEMOGLOBIN: 10.4 g/dL — AB (ref 12.0–15.0)
LYMPHS PCT: 50 %
Lymphs Abs: 1.1 10*3/uL (ref 0.7–4.0)
MCH: 28.7 pg (ref 26.0–34.0)
MCHC: 34.2 g/dL (ref 30.0–36.0)
MCV: 84 fL (ref 78.0–100.0)
Monocytes Absolute: 0.9 10*3/uL (ref 0.1–1.0)
Monocytes Relative: 44 %
NEUTROS ABS: 0.1 10*3/uL — AB (ref 1.7–7.7)
Neutrophils Relative %: 5 %
Platelets: 130 10*3/uL — ABNORMAL LOW (ref 150–400)
RBC: 3.62 MIL/uL — ABNORMAL LOW (ref 3.87–5.11)
RDW: 14.3 % (ref 11.5–15.5)
WBC: 2.1 10*3/uL — ABNORMAL LOW (ref 4.0–10.5)

## 2017-12-08 NOTE — Progress Notes (Deleted)
Attempted to call report 4 times to [facility] center with no response. Patient has left with ambulance back to rehab center

## 2017-12-08 NOTE — Progress Notes (Signed)
PROGRESS NOTE    Kelli Crane  AVW:[id number] DOB: 1955-11-10 DOA: 12/05/2017 PCP: Patient, No Pcp Per    Brief Narrative:  62 year old female who presented with fever, nausea and malaise.  She does have significant past medical history for CLL undergoing chemotherapy, (last infusion 2 weeks ago), and asthma.  She developed fevers which were persistent, associated with malaise and nausea.  No cough, no diarrhea, no dysuria.  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  Initial physical examination blood pressure 112/63, heart rate 113, respiratory 12, oxygen saturation 91-100%, temperature 100.7.  Moist mucous membranes, lungs clear to auscultation bilaterally, no wheezing, rales rhonchi, heart S1-S2 present and rhythmic, no gallops, rubs or murmurs, the abdomen was soft nontender, no lower extremity edema.  Patient was admitted to the hospital with the working diagnosis of neutropenic fever   Assessment & Plan:   Principal Problem:   Neutropenic fever ([place]) Active Problems:   Asthma   Hypokalemia   Pancytopenia (HCC)   CLL (chronic lymphocytic leukemia) ([place])   Breast cancer ([place])   1.  Neutropenic fever. Patient has remained afebrile, tolerating well meropenem. Patient had generalized rash related to amoxicillin in the past, while getting chemotherapy. May benefit from allergy testing, for now will hold on b lactams. Wbc at 2.1, % neutrophils 0.1. Will follow on cell count in am. Will dc telemetry monitoring. Patient had filigrastim on 04/09. Continue gentle hydration with balanced crystalloid solutions, will increase rate to 75 ml per hour. Blood pressure with systolic in the 82N.   2. Asthma.On budesonide, and as needed albuterol. No current exacerbation.   3. Anemia and thrombocytopenia. Cell counts with hb at 10,4, plt at 130. Continue to follow up on cell count in am.  4. GERD. On pantoprazole, tolerating po well, increased appetite.    DVT  prophylaxis:scd  Code Status: full Family Communication: I spoke with patient's family at the bedside and all questions were addressed.  Disposition Plan: home when more stable   Consultants:     Procedures:     Antimicrobials:   meropenem   Subjective: Patient feeling better, has been out of bed and tolerating po well, no further fever, no nausea or vomiting, no dyspnea or chest pain.  Objective: Vitals:   12/07/17 2130 12/08/17 0548 12/08/17 0744 12/08/17 0942  BP: (!) 100/56 (!) 92/52  (!) 91/51  Pulse: 88 81  93  Resp: 19 18  18   Temp: 99.3 F (37.4 C) 98.8 F (37.1 C)  98.5 F (36.9 C)  TempSrc: Oral Oral  Oral  SpO2: 97% 99% 95% 99%  Weight: 65 kg (143 lb 4.5 oz)     Height:        Intake/Output Summary (Last 24 hours) at 12/08/2017 1407 Last data filed at 12/08/2017 0600 Gross per 24 hour  Intake 1413.33 ml  Output 800 ml  Net 613.33 ml   Filed Weights   12/06/17 0020 12/06/17 2028 12/07/17 2130  Weight: 65 kg (143 lb 4.8 oz) 65 kg (143 lb 4.9 oz) 65 kg (143 lb 4.5 oz)    Examination:   General: Not in pain or dyspnea,  Neurology: Awake and alert, non focal  E ENT: mild pallor, no icterus, oral mucosa moist Cardiovascular: No JVD. S1-S2 present, rhythmic, no gallops, rubs, or murmurs. No lower extremity edema. Pulmonary: vesicular breath sounds bilaterally, adequate air movement, no wheezing, rhonchi or rales. Gastrointestinal. Abdomen flat, no organomegaly, non tender, no rebound or guarding Skin. No rashes Musculoskeletal: no joint  deformities     Data Reviewed: I have personally reviewed following labs and imaging studies  CBC: Recent Labs  Lab 12/05/17 1834 12/06/17 0643 12/07/17 0453 12/08/17 0457  WBC 1.6* 1.7* 1.9* 2.1*  NEUTROABS 0.1* 0.0* 0.1* 0.1*  HGB 10.8* 9.2* 9.8* 10.4*  HCT 29.8* 26.3* 29.1* 30.4*  MCV 83.5 83.0 83.1 84.0  PLT 133* 108* 99* 465*   Basic Metabolic Panel: Recent Labs  Lab 12/05/17 1834  12/06/17 0643 12/07/17 0453  NA 136 138 135  K 3.4* 3.4* 3.7  CL 103 112* 102  CO2 22 18* 21*  GLUCOSE 123* 99 99  BUN 14 9 7   CREATININE 0.96 0.88 0.90  CALCIUM 9.1 7.8* 8.8*  MG  --  1.4* 2.0   GFR: Estimated Creatinine Clearance: 66.2 mL/min (by C-G formula based on SCr of 0.9 mg/dL). Liver Function Tests: Recent Labs  Lab 12/05/17 1834  AST 20  ALT 16  ALKPHOS 79  BILITOT 0.7  PROT 6.9  ALBUMIN 4.3   No results for input(s): LIPASE, AMYLASE in the last 168 hours. No results for input(s): AMMONIA in the last 168 hours. Coagulation Profile: Recent Labs  Lab 12/05/17 1834  INR 1.00   Cardiac Enzymes: No results for input(s): CKTOTAL, CKMB, CKMBINDEX, TROPONINI in the last 168 hours. BNP (last 3 results) No results for input(s): PROBNP in the last 8760 hours. HbA1C: No results for input(s): HGBA1C in the last 72 hours. CBG: No results for input(s): GLUCAP in the last 168 hours. Lipid Profile: No results for input(s): CHOL, HDL, LDLCALC, TRIG, CHOLHDL, LDLDIRECT in the last 72 hours. Thyroid Function Tests: No results for input(s): TSH, T4TOTAL, FREET4, T3FREE, THYROIDAB in the last 72 hours. Anemia Panel: No results for input(s): VITAMINB12, FOLATE, FERRITIN, TIBC, IRON, RETICCTPCT in the last 72 hours.    Radiology Studies: I have reviewed all of the imaging during this hospital visit personally     Scheduled Meds: . budesonide  0.5 mg Nebulization BID  . montelukast  10 mg Oral QHS  . pantoprazole  40 mg Oral Daily  . sodium chloride flush  3 mL Intravenous Q12H   Continuous Infusions: . dextrose 5% lactated ringers 75 mL/hr at 12/08/17 1018  . meropenem (MERREM) IV Stopped (12/08/17 6812)     LOS: 3 days        Amran Malter Gerome Apley, MD [facility] Pager [phone]

## 2017-12-09 LAB — CBC WITH DIFFERENTIAL/PLATELET
BASOS ABS: 0 10*3/uL (ref 0.0–0.1)
Basophils Relative: 0 %
EOS ABS: 0 10*3/uL (ref 0.0–0.7)
Eosinophils Relative: 0 %
HCT: 30.1 % — ABNORMAL LOW (ref 36.0–46.0)
HEMOGLOBIN: 10.2 g/dL — AB (ref 12.0–15.0)
Lymphocytes Relative: 58 %
Lymphs Abs: 1.2 10*3/uL (ref 0.7–4.0)
MCH: 28.4 pg (ref 26.0–34.0)
MCHC: 33.9 g/dL (ref 30.0–36.0)
MCV: 83.8 fL (ref 78.0–100.0)
MONOS PCT: 31 %
Monocytes Absolute: 0.6 10*3/uL (ref 0.1–1.0)
NEUTROS PCT: 11 %
Neutro Abs: 0.2 10*3/uL — ABNORMAL LOW (ref 1.7–7.7)
PLATELETS: 153 10*3/uL (ref 150–400)
RBC: 3.59 MIL/uL — AB (ref 3.87–5.11)
RDW: 14.1 % (ref 11.5–15.5)
Smear Review: ADEQUATE
WBC: 2 10*3/uL — ABNORMAL LOW (ref 4.0–10.5)

## 2017-12-09 MED ORDER — TBO-FILGRASTIM 300 MCG/0.5ML ~~LOC~~ SOSY
300.0000 ug | PREFILLED_SYRINGE | Freq: Once | SUBCUTANEOUS | Status: AC
  Administered 2017-12-09: 300 ug via SUBCUTANEOUS
  Filled 2017-12-09: qty 0.5

## 2017-12-09 NOTE — Progress Notes (Signed)
Pharmacy Antibiotic Note  Kelli Crane is a 62 y.o. female admitted on 12/05/2017 with neutropenic fever.  Pharmacy has been consulted for meropenem dosing.  Currently afebrile.  WBC slowly trending up.  Received Neupogen 4/9.  Recheck ANC tomorrow.  Plan: Continue Meropenem 1gm IV q8h Recheck ANC tomorrow.  Height: 5\' 8"  (172.7 cm) Weight: 143 lb 4.5 oz (65 kg) IBW/kg (Calculated) : 63.9  Temp (24hrs), Avg:98.3 F (36.8 C), Min:97.8 F (36.6 C), Max:98.8 F (37.1 C)  Recent Labs  Lab 12/05/17 1834 12/05/17 1843 12/05/17 2247 12/06/17 0643 12/07/17 0453 12/08/17 0457 12/09/17 0911  WBC 1.6*  --   --  1.7* 1.9* 2.1* 2.0*  CREATININE 0.96  --   --  0.88 0.90  --   --   LATICACIDVEN  --  1.22 1.52  --   --   --   --     Estimated Creatinine Clearance: 66.2 mL/min (by C-G formula based on SCr of 0.9 mg/dL).    Allergies  Allergen Reactions  . Amoxicillin   . Clarithromycin   . Clindamycin/Lincomycin   . Erythromycin Nausea And Vomiting  . Tape     Antimicrobials this admission: Levaquin 4/8 x 1 Azactam 4/8 x 1 Vancomycin 4/8 x 1 Meropenem 4/9 >>  Dose adjustments this admission: none  Microbiology results: 4/8 Blood x 2 - ngtd (only 1 culture done?) 4/8 Urine - < 10 K/ml, final  4/8 resp panel NEG 4/8 HIV non reactive  Thank you for allowing pharmacy to be a part of this patient's care.  [employer], Pharm.D., BCPS Clinical Pharmacist Pager: [phone] Clinical phone for 12/09/2017 from 8:30-4:00 is [phone]. After 4pm, please call Main Rx [phone]) for assistance. 12/09/2017 12:47 PM

## 2017-12-09 NOTE — Progress Notes (Signed)
PROGRESS NOTE    Kelli Crane  EQA:[id number] DOB: Oct 23, 1955 DOA: 12/05/2017 PCP: Patient, No Pcp Per    Brief Narrative:  62 year old female who presented with fever, nausea and malaise.  She does have significant past medical history for CLL undergoing chemotherapy, (last infusion 2 weeks ago), and asthma.  She developed fevers which were persistent, associated with malaise and nausea.  No cough, no diarrhea, no dysuria.  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  Initial physical examination blood pressure 112/63, heart rate 113, respiratory 12, oxygen saturation 91-100%, temperature 100.7.  Moist mucous membranes, lungs clear to auscultation bilaterally, no wheezing, rales rhonchi, heart S1-S2 present and rhythmic, no gallops, rubs or murmurs, the abdomen was soft nontender, no lower extremity edema.  Patient was admitted to the hospital with the working diagnosis of neutropenic fever.  Assessment & Plan:   Principal Problem:   Neutropenic fever ([place]) Active Problems:   Asthma   Hypokalemia   Pancytopenia (HCC)   CLL (chronic lymphocytic leukemia) (HCC)   Breast cancer ([place])   1.  Neutropenic fever. Patient feeling well, no signs of systemic infection, continue prophylactic antibiotic while neutropenic, absolute neutrophils, dow to 200. Will give second dose of filagrastim, patient reported to take daily for post chemotherapy neutropenia. I spoke with Dr. Verner Chol, primary oncology, cutoff for discharge will be at least 500 neutrophils. Patient may go home with oral antibiotic therapy and he will follow in the office cell count. To fax results to [phone]. Will continue hydration with dextrose and balance electrolyte solution.    2. Asthma. Continue to be stable wwith no signs of exacerbation. On budesonide, and albuterol.   3. Anemia and thrombocytopenia. Hb at 10.2 and platelets at 153, will continue daily cell count monitoring until discharge.   4. GERD.  Continue pantoprazole po, with good toleration.    DVT prophylaxis:scd  Code Status: full Family Communication: no family at the bedside Disposition Plan: home when more stable   Consultants:     Procedures:     Antimicrobials    Subjective: Patient is feeling well, no nausea or vomiting, positive weakness, no fever or chills. Patient usually gets daily filigastrin after chemotherapy for leukopenia.   Objective: Vitals:   12/08/17 2052 12/09/17 0606 12/09/17 0932 12/09/17 1016  BP: 102/60 113/65  (!) 102/56  Pulse: 86 83  84  Resp: 18 18  17   Temp: 98.8 F (37.1 C) 98.3 F (36.8 C)  98.1 F (36.7 C)  TempSrc: Oral Oral  Oral  SpO2: 99% 98% 98% 100%  Weight: 65 kg (143 lb 4.5 oz)     Height:        Intake/Output Summary (Last 24 hours) at 12/09/2017 1410 Last data filed at 12/09/2017 1000 Gross per 24 hour  Intake 2482.5 ml  Output 2600 ml  Net -117.5 ml   Filed Weights   12/06/17 2028 12/07/17 2130 12/08/17 2052  Weight: 65 kg (143 lb 4.9 oz) 65 kg (143 lb 4.5 oz) 65 kg (143 lb 4.5 oz)    Examination:   General: Not in pain or dyspnea, deconditioned Neurology: Awake and alert, non focal  E ENT: mild pallor, no icterus, oral mucosa moist Cardiovascular: No JVD. S1-S2 present, rhythmic, no gallops, rubs, or murmurs. No lower extremity edema. Pulmonary: vesicular breath sounds bilaterally, adequate air movement, no wheezing, rhonchi or rales. Gastrointestinal. Abdomen flat, no organomegaly, non tender, no rebound or guarding Skin. No rashes Musculoskeletal: no joint deformities     Data Reviewed:  I have personally reviewed following labs and imaging studies  CBC: Recent Labs  Lab 12/05/17 1834 12/06/17 0643 12/07/17 0453 12/08/17 0457 12/09/17 0911  WBC 1.6* 1.7* 1.9* 2.1* 2.0*  NEUTROABS 0.1* 0.0* 0.1* 0.1* 0.2*  HGB 10.8* 9.2* 9.8* 10.4* 10.2*  HCT 29.8* 26.3* 29.1* 30.4* 30.1*  MCV 83.5 83.0 83.1 84.0 83.8  PLT 133* 108* 99* 130*  938   Basic Metabolic Panel: Recent Labs  Lab 12/05/17 1834 12/06/17 0643 12/07/17 0453  NA 136 138 135  K 3.4* 3.4* 3.7  CL 103 112* 102  CO2 22 18* 21*  GLUCOSE 123* 99 99  BUN 14 9 7   CREATININE 0.96 0.88 0.90  CALCIUM 9.1 7.8* 8.8*  MG  --  1.4* 2.0   GFR: Estimated Creatinine Clearance: 66.2 mL/min (by C-G formula based on SCr of 0.9 mg/dL). Liver Function Tests: Recent Labs  Lab 12/05/17 1834  AST 20  ALT 16  ALKPHOS 79  BILITOT 0.7  PROT 6.9  ALBUMIN 4.3   No results for input(s): LIPASE, AMYLASE in the last 168 hours. No results for input(s): AMMONIA in the last 168 hours. Coagulation Profile: Recent Labs  Lab 12/05/17 1834  INR 1.00   Cardiac Enzymes: No results for input(s): CKTOTAL, CKMB, CKMBINDEX, TROPONINI in the last 168 hours. BNP (last 3 results) No results for input(s): PROBNP in the last 8760 hours. HbA1C: No results for input(s): HGBA1C in the last 72 hours. CBG: No results for input(s): GLUCAP in the last 168 hours. Lipid Profile: No results for input(s): CHOL, HDL, LDLCALC, TRIG, CHOLHDL, LDLDIRECT in the last 72 hours. Thyroid Function Tests: No results for input(s): TSH, T4TOTAL, FREET4, T3FREE, THYROIDAB in the last 72 hours. Anemia Panel: No results for input(s): VITAMINB12, FOLATE, FERRITIN, TIBC, IRON, RETICCTPCT in the last 72 hours.    Radiology Studies: I have reviewed all of the imaging during this hospital visit personally     Scheduled Meds: . budesonide  0.5 mg Nebulization BID  . montelukast  10 mg Oral QHS  . pantoprazole  40 mg Oral Daily  . sodium chloride flush  3 mL Intravenous Q12H  . Tbo-Filgrastim  300 mcg Subcutaneous Once   Continuous Infusions: . dextrose 5% lactated ringers 75 mL/hr at 12/09/17 0447  . meropenem (MERREM) IV Stopped (12/09/17 1111)     LOS: 4 days        Todd Argabright Gerome Apley, MD [facility] Pager [phone]

## 2017-12-10 DIAGNOSIS — C50919 Malignant neoplasm of unspecified site of unspecified female breast: Secondary | ICD-10-CM

## 2017-12-10 LAB — CBC WITH DIFFERENTIAL/PLATELET
BLASTS: 0 %
Band Neutrophils: 15 %
Basophils Absolute: 0 10*3/uL (ref 0.0–0.1)
Basophils Relative: 0 %
Eosinophils Absolute: 0.1 10*3/uL (ref 0.0–0.7)
Eosinophils Relative: 1 %
HEMATOCRIT: 31.7 % — AB (ref 36.0–46.0)
Hemoglobin: 10.6 g/dL — ABNORMAL LOW (ref 12.0–15.0)
LYMPHS PCT: 34 %
Lymphs Abs: 1.8 10*3/uL (ref 0.7–4.0)
MCH: 27.7 pg (ref 26.0–34.0)
MCHC: 33.4 g/dL (ref 30.0–36.0)
MCV: 82.8 fL (ref 78.0–100.0)
METAMYELOCYTES PCT: 0 %
MONOS PCT: 14 %
Monocytes Absolute: 0.7 10*3/uL (ref 0.1–1.0)
Myelocytes: 0 %
NEUTROS ABS: 2.6 10*3/uL (ref 1.7–7.7)
NEUTROS PCT: 36 %
NRBC: 0 /100{WBCs}
OTHER: 0 %
Platelets: 140 10*3/uL — ABNORMAL LOW (ref 150–400)
Promyelocytes Relative: 0 %
RBC: 3.83 MIL/uL — AB (ref 3.87–5.11)
RDW: 13.9 % (ref 11.5–15.5)
WBC: 5.2 10*3/uL (ref 4.0–10.5)

## 2017-12-10 LAB — CULTURE, BLOOD (ROUTINE X 2)
CULTURE: NO GROWTH
Special Requests: ADEQUATE

## 2017-12-10 MED ORDER — LEVOFLOXACIN 750 MG PO TABS: 750.0000 mg | ORAL_TABLET | Freq: Every day | ORAL | 0 refills | Status: AC

## 2017-12-10 MED ORDER — LEVOFLOXACIN 750 MG PO TABS
750.0000 mg | ORAL_TABLET | Freq: Every day | ORAL | Status: DC
  Administered 2017-12-10: 750 mg via ORAL
  Filled 2017-12-10: qty 1

## 2017-12-10 NOTE — Progress Notes (Signed)
Reviewed discharge instructions with patient and family.  These included the following:  When to call the MD, follow-up appointments, follow-up blood work to be done, prescriptions, symptoms to be aware of, Exit care information on antibiotics and current medical conditions, etc.  Patient discharged to son's home via private vehicle accompanied by grandchildren.  Escorted to exit via wheelchair accompanied by nurse tech.

## 2017-12-10 NOTE — Discharge Summary (Addendum)
Physician Discharge Summary  Parminder Krack ATF:[id number] DOB: 1956/04/25 DOA: 12/05/2017  PCP: Patient, No Pcp Per  Admit date: 12/05/2017 Discharge date: 12/10/2017  Admitted From: Home Disposition:  Home  Recommendations for Outpatient Follow-up and new medication changes:  1. Follow up with PCP in 1- week 2. Follow CBC on 12/12/2017 and fax to Dr. Jeannie Fend (Oncology) [phone]. 3. Prescribed Levofloxacin 750 mg for 3 days. Patient has been afebrile for the last 72 hours, recovery of absolute neutrophil count today (1,872).   4. Patient was instructed to return to the hospital if recurrent fever.   Home Health: no   Equipment/Devices: no   Discharge Condition: stable CODE STATUS: full   Diet recommendation: Regular diet  Brief/Interim Summary: 62 year old female who presented with fever, nausea and malaise. She does have significant past medical history for CLL undergoing chemotherapy, (last infusion 2 weeks ago),and asthma. She developed fevers which were persistent, associated with malaise and nausea. No cough, no diarrhea, no dysuria. 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 Initial physical examination blood pressure 112/63, heart rate 113, respiratory rate 12, oxygen saturation 91-100%, temperature 100.7.Moist mucous membranes, lungs clear to auscultation bilaterally, no wheezing, rales rhonchi, heart S1-S2 present and rhythmic, no gallops, rubs or murmurs, the abdomen was soft nontender, no lower extremity edema.  Sodium 136, potassium 3.4, chloride 103, bicarb 22, glucose 123, BUN 14, creatinine 0.96, white count 1.6, hemoglobin 10.8, hematocrit 39.8, platelets 133, percent neutrophils 4, absolute 0.1.  Urine analysis  with specific gravity 1.010, RBC 0-5, white cells 6-30.  CT of the sinuses with moderate circumferential left maxillary sinus mucosal thickening consistent with chronic sinusitis.  Chest x-ray negative for infiltrates.  EKG with sinus tachycardia,  normal axis, normal intervals.  Patient was admitted to the hospitalwith theworking diagnosis of neutropenic fever  1.  Neutropenic fever in the setting of CLL undergoing chemotherapy.  Patient was admitted to the medical ward, she was placed on a remote telemetry monitor, received broad-spectrum antibiotic therapy with meropenem intravenously.  No signs of systemic infection noted.  Her last T-max was on April 10, 37.9 C.  She recover her absolute neutrophil count on April 13, total white cell count of 5.2, percent neutrophils 36%, absolute 2.6.  Patient will be discharged on levofloxacin 750 g daily for 3 more days.  Patient received 2 doses Tbo-Filgastrim with good toleration.  Count April 15, fax to Dr. Jeannie Fend, primary oncologist in [place].   2.  Anemia and thrombocytopenia.  Related to CLL, clinically stable.  Discharge hemoglobin 10.6, platelets 140.  No transfusion required.  3.  Asthma.  Only mild intermittent, no signs of exacerbation.  Continue albuterol and Singulair.  4.  GERD.  Continue proton pump inhibitors.  5. Hypokalemia. Potassium was corrected with kcl, at discharge 3.7 with preserved renal function.     Discharge Diagnoses:  Principal Problem:   Neutropenic fever ([place]) Active Problems:   Asthma   Hypokalemia   Pancytopenia ([facility])   CLL (chronic lymphocytic leukemia) ([facility])   Breast cancer ([place])    Discharge Instructions   Allergies as of 12/10/2017      Reactions   Amoxicillin    Clarithromycin    Clindamycin/lincomycin    Erythromycin Nausea And Vomiting   Tape       Medication List    STOP taking these medications   mupirocin cream 2 % Commonly known as:  BACTROBAN     TAKE these medications   albuterol 2 MG/5ML syrup Commonly known as:  PROVENTIL,VENTOLIN Take 2 mg by mouth 3 (three) times daily.   cetirizine 10 MG tablet Commonly known as:  ZYRTEC Take 10 mg by mouth daily.   Ibrutinib 420 MG Tabs Take by mouth.   lansoprazole 30  MG capsule Commonly known as:  PREVACID Take 30 mg by mouth daily at 12 noon.   levofloxacin 750 MG tablet Commonly known as:  LEVAQUIN Take 1 tablet (750 mg total) by mouth daily for 3 days.   mometasone 220 MCG/INH inhaler Commonly known as:  ASMANEX Inhale 2 puffs into the lungs daily.   mometasone 50 MCG/ACT nasal spray Commonly known as:  NASONEX Place 2 sprays into the nose daily.   montelukast 10 MG tablet Commonly known as:  SINGULAIR Take 10 mg by mouth at bedtime.   pseudoephedrine 120 MG 12 hr tablet Commonly known as:  SUDAFED Take 120 mg by mouth 2 (two) times daily.   VENCLEXTA 100 MG Tabs Generic drug:  venetoclax Take 400 mg by mouth daily.       Allergies  Allergen Reactions  . Amoxicillin   . Clarithromycin   . Clindamycin/Lincomycin   . Erythromycin Nausea And Vomiting  . Tape     Consultations:     Procedures/Studies: Dg Chest 2 View  Result Date: 12/05/2017 CLINICAL DATA:  Fever, vertigo. EXAM: CHEST - 2 VIEW COMPARISON:  None. FINDINGS: The heart size and mediastinal contours are within normal limits. Both lungs are clear. No pneumothorax or pleural effusion is noted. The visualized skeletal structures are unremarkable. IMPRESSION: No active cardiopulmonary disease. Electronically Signed   By: Marijo Conception, M.D.   On: 12/05/2017 19:02   Ct Maxillofacial Wo Contrast  Result Date: 12/05/2017 CLINICAL DATA:  Severe frontal headache x3 days with pain behind the maxillary sinuses. Fever. History of breast cancer and asthma. Suspect sinusitis. EXAM: CT MAXILLOFACIAL WITHOUT CONTRAST TECHNIQUE: Multidetector CT imaging of the maxillofacial structures was performed. Multiplanar CT image reconstructions were also generated. COMPARISON:  None. FINDINGS: Osseous: No fracture or mandibular dislocation. No destructive process. Mild dextroconvex curvature of the nasal septum. Orbits: Negative. No traumatic or inflammatory finding. Sinuses: Moderate  circumferential mucosal thickening of the left maxillary sinus without air-fluid levels. Underdeveloped frontal sinus. Right maxillary, ethmoid and sphenoid sinuses are nonacute. Soft tissues: Unremarkable Limited intracranial: Nonacute IMPRESSION: Moderate circumferential left maxillary sinus mucosal thickening consistent with chronic sinusitis. Electronically Signed   By: Ashley Royalty M.D.   On: 12/05/2017 19:13       Subjective: Patient is feeling better, no nausea or abdominal pain, no diarrhea, no cough or dyspnea.   Discharge Exam: Vitals:   12/10/17 0949 12/10/17 0959  BP: (!) 99/57   Pulse: 86   Resp: 20   Temp: 98.1 F (36.7 C)   SpO2: 99% 99%   Vitals:   12/09/17 2128 12/10/17 0605 12/10/17 0949 12/10/17 0959  BP: (!) 115/56 106/63 (!) 99/57   Pulse: 87 83 86   Resp:   20   Temp: 98.6 F (37 C) 98.4 F (36.9 C) 98.1 F (36.7 C)   TempSrc: Oral Oral Oral   SpO2: 99% 97% 99% 99%  Weight:      Height:        General: Not in pain or dyspnea Neurology: Awake and alert, non focal  E ENT: mild pallor, no icterus, oral mucosa moist Cardiovascular: No JVD. S1-S2 present, rhythmic, no gallops, rubs, or murmurs. No lower extremity edema. Pulmonary: vesicular breath sounds bilaterally, adequate air movement, no  wheezing, rhonchi or rales. Gastrointestinal. Abdomen flat, no organomegaly, non tender, no rebound or guarding Skin. No rashes Musculoskeletal: no joint deformities   The results of significant diagnostics from this hospitalization (including imaging, microbiology, ancillary and laboratory) are listed below for reference.     Microbiology: Recent Results (from the past 240 hour(s))  Culture, blood (Routine x 2)     Status: None (Preliminary result)   Collection Time: 12/05/17  6:30 PM  Result Value Ref Range Status   Specimen Description   Final    BLOOD RIGHT ANTECUBITAL Performed at [facility], [address] [place], [place] [address]     Special Requests   Final    BOTTLES DRAWN AEROBIC AND ANAEROBIC Blood Culture adequate volume Performed at [facility], [address] [place], [place] [address]    Culture   Final    NO GROWTH 4 DAYS Performed at [facility], [place] [address] [place], [place] [address]    Report Status PENDING  Incomplete  Urine culture     Status: Abnormal   Collection Time: 12/05/17  8:33 PM  Result Value Ref Range Status   Specimen Description   Final    URINE, CLEAN CATCH Performed at [facility], [address] [place], [place] [address]    Special Requests   Final    Immunocompromised Performed at [facility], [address] [place], [place] [address]    Culture (A)  Final    <10,000 COLONIES/mL Performed at [facility], [place] [address], [place], [place] [address]    Report Status 12/07/2017 FINAL  Final  Respiratory Panel by PCR     Status: None   Collection Time: 12/05/17 10:45 PM  Result Value Ref Range Status   Adenovirus NOT DETECTED NOT DETECTED Final   Coronavirus 229E NOT DETECTED NOT DETECTED Final   Coronavirus HKU1 NOT DETECTED NOT DETECTED Final   Coronavirus NL63 NOT DETECTED NOT DETECTED Final   Coronavirus OC43 NOT DETECTED NOT DETECTED Final   Metapneumovirus NOT DETECTED NOT DETECTED Final   Rhinovirus / Enterovirus NOT DETECTED NOT DETECTED Final   Influenza A NOT DETECTED NOT DETECTED Final   Influenza B NOT DETECTED NOT DETECTED Final   Parainfluenza Virus 1 NOT DETECTED NOT DETECTED Final   Parainfluenza Virus 2 NOT DETECTED NOT DETECTED Final   Parainfluenza Virus 3 NOT DETECTED NOT DETECTED Final   Parainfluenza Virus 4 NOT DETECTED NOT DETECTED Final   Respiratory Syncytial Virus NOT DETECTED NOT DETECTED Final   Bordetella pertussis NOT DETECTED NOT DETECTED Final   Chlamydophila pneumoniae NOT DETECTED NOT DETECTED Final   Mycoplasma pneumoniae NOT DETECTED NOT DETECTED Final    Comment: Performed at [facility], [place] [address], [place],  [address]     Labs: BNP (last 3 results) No results for input(s): BNP in the last 8760 hours. Basic Metabolic Panel: Recent Labs  Lab 12/05/17 1834 12/06/17 0643 12/07/17 0453  NA 136 138 135  K 3.4* 3.4* 3.7  CL 103 112* 102  CO2 22 18* 21*  GLUCOSE 123* 99 99  BUN 14 9 7   CREATININE 0.96 0.88 0.90  CALCIUM 9.1 7.8* 8.8*  MG  --  1.4* 2.0   Liver Function Tests: Recent Labs  Lab 12/05/17 1834  AST 20  ALT 16  ALKPHOS 79  BILITOT 0.7  PROT 6.9  ALBUMIN 4.3   No  results for input(s): LIPASE, AMYLASE in the last 168 hours. No results for input(s): AMMONIA in the last 168 hours. CBC: Recent Labs  Lab 12/06/17 0643 12/07/17 0453 12/08/17 0457 12/09/17 0911 12/10/17 0832  WBC 1.7* 1.9* 2.1* 2.0* 5.2  NEUTROABS 0.0* 0.1* 0.1* 0.2* 2.6  HGB 9.2* 9.8* 10.4* 10.2* 10.6*  HCT 26.3* 29.1* 30.4* 30.1* 31.7*  MCV 83.0 83.1 84.0 83.8 82.8  PLT 108* 99* 130* 153 140*   Cardiac Enzymes: No results for input(s): CKTOTAL, CKMB, CKMBINDEX, TROPONINI in the last 168 hours. BNP: Invalid input(s): POCBNP CBG: No results for input(s): GLUCAP in the last 168 hours. D-Dimer No results for input(s): DDIMER in the last 72 hours. Hgb A1c No results for input(s): HGBA1C in the last 72 hours. Lipid Profile No results for input(s): CHOL, HDL, LDLCALC, TRIG, CHOLHDL, LDLDIRECT in the last 72 hours. Thyroid function studies No results for input(s): TSH, T4TOTAL, T3FREE, THYROIDAB in the last 72 hours.  Invalid input(s): FREET3 Anemia work up No results for input(s): VITAMINB12, FOLATE, FERRITIN, TIBC, IRON, RETICCTPCT in the last 72 hours. Urinalysis    Component Value Date/Time   COLORURINE YELLOW 12/05/2017 1807   APPEARANCEUR CLEAR 12/05/2017 1807   LABSPEC 1.010 12/05/2017 1807   PHURINE 6.5 12/05/2017 1807   GLUCOSEU NEGATIVE 12/05/2017 1807   HGBUR TRACE (A) 12/05/2017 1807   BILIRUBINUR NEGATIVE 12/05/2017 1807    KETONESUR NEGATIVE 12/05/2017 1807   PROTEINUR NEGATIVE 12/05/2017 1807   UROBILINOGEN 0.2 10/16/2012 2116   NITRITE NEGATIVE 12/05/2017 1807   LEUKOCYTESUR MODERATE (A) 12/05/2017 1807   Sepsis Labs Invalid input(s): PROCALCITONIN,  WBC,  LACTICIDVEN Microbiology Recent Results (from the past 240 hour(s))  Culture, blood (Routine x 2)     Status: None (Preliminary result)   Collection Time: 12/05/17  6:30 PM  Result Value Ref Range Status   Specimen Description   Final    BLOOD RIGHT ANTECUBITAL Performed at [facility], [address] [place], [place] [address]    Special Requests   Final    BOTTLES DRAWN AEROBIC AND ANAEROBIC Blood Culture adequate volume Performed at [facility], [address] [place], [place] [address]    Culture   Final    NO GROWTH 4 DAYS Performed at [facility], [place] [address] [place], [place] [address]    Report Status PENDING  Incomplete  Urine culture     Status: Abnormal   Collection Time: 12/05/17  8:33 PM  Result Value Ref Range Status   Specimen Description   Final    URINE, CLEAN CATCH Performed at [facility], [address] [place], [place] [address]    Special Requests   Final    Immunocompromised Performed at [facility], [address] [place], [place] [address]    Culture (A)  Final    <10,000 COLONIES/mL Performed at [facility], [place] [address] [place], [place] [address]    Report Status 12/07/2017 FINAL  Final  Respiratory Panel by PCR     Status: None   Collection Time: 12/05/17 10:45 PM  Result Value Ref Range Status   Adenovirus NOT DETECTED NOT DETECTED Final   Coronavirus 229E NOT DETECTED NOT DETECTED Final   Coronavirus HKU1 NOT DETECTED NOT DETECTED Final   Coronavirus NL63 NOT DETECTED NOT DETECTED Final   Coronavirus OC43 NOT DETECTED NOT DETECTED Final   Metapneumovirus NOT DETECTED NOT DETECTED Final  Rhinovirus / Enterovirus NOT DETECTED NOT  DETECTED Final   Influenza A NOT DETECTED NOT DETECTED Final   Influenza B NOT DETECTED NOT DETECTED Final   Parainfluenza Virus 1 NOT DETECTED NOT DETECTED Final   Parainfluenza Virus 2 NOT DETECTED NOT DETECTED Final   Parainfluenza Virus 3 NOT DETECTED NOT DETECTED Final   Parainfluenza Virus 4 NOT DETECTED NOT DETECTED Final   Respiratory Syncytial Virus NOT DETECTED NOT DETECTED Final   Bordetella pertussis NOT DETECTED NOT DETECTED Final   Chlamydophila pneumoniae NOT DETECTED NOT DETECTED Final   Mycoplasma pneumoniae NOT DETECTED NOT DETECTED Final    Comment: Performed at [facility], [place] [address] [place], [place] [address]     Time coordinating discharge: 45 minutes  SIGNED:   Tawni Millers, MD  [facility] 12/10/2017, 1:46 PM Pager [phone]  If 7PM-7AM, please contact night-coverage [email or web address] Password [email or web address]

## 2018-02-03 ENCOUNTER — Other Ambulatory Visit: Payer: Self-pay | Admitting: Gastroenterology

## 2018-02-03 ENCOUNTER — Inpatient Hospital Stay: Admit: 2018-02-03 | Discharge: 2018-02-03 | Disposition: A | Discharge status: Home / Self Care | Payer: Self-pay

## 2018-08-18 ENCOUNTER — Other Ambulatory Visit: Payer: Self-pay | Admitting: Gastroenterology

## 2018-08-18 ENCOUNTER — Inpatient Hospital Stay: Admit: 2018-08-18 | Discharge: 2018-08-18 | Disposition: A | Discharge status: Home / Self Care | Payer: Self-pay

## 2018-10-15 IMAGING — CR DG CHEST 2V
2 series · 2 of 2 positions shown · non-contrast
Comparison: None.

CLINICAL DATA: Fever, vertigo.

EXAM:
CHEST - 2 VIEW

[w chest ap]
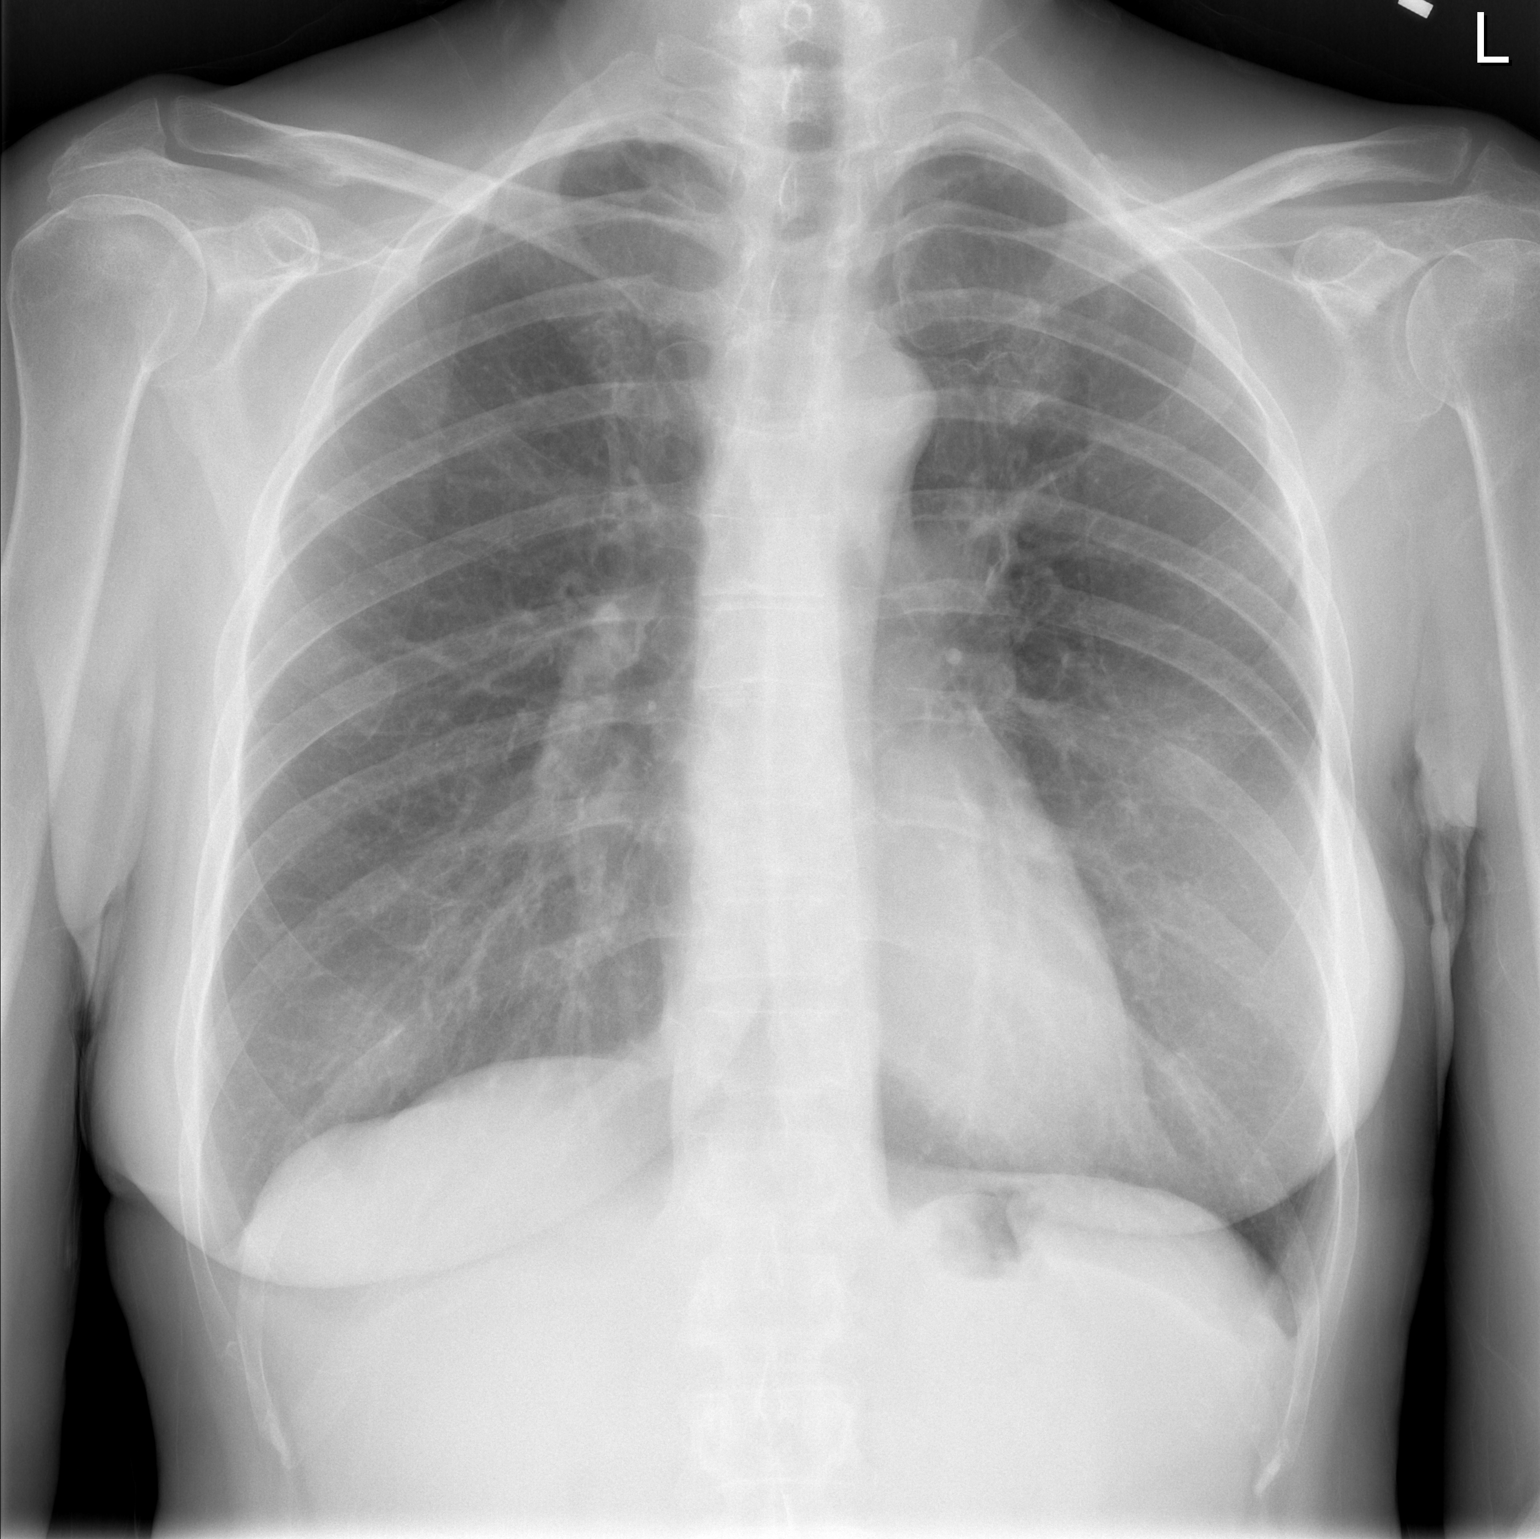

[w chest lat]
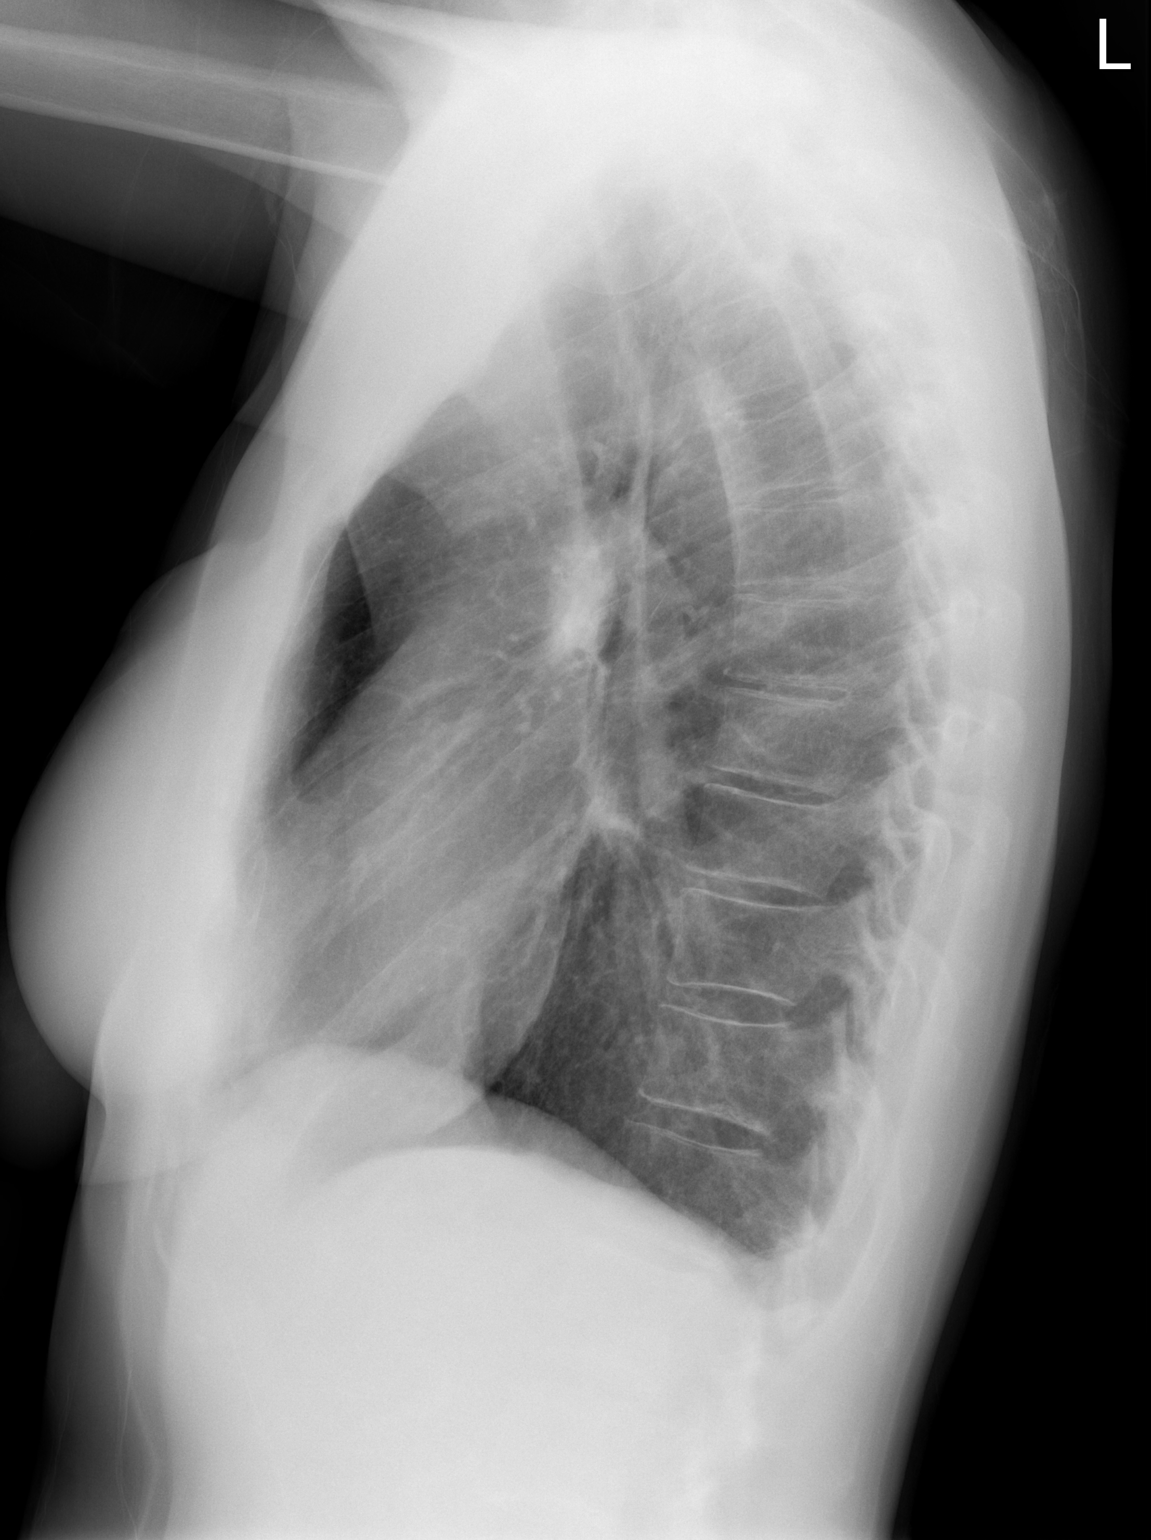

[2 of 2 positions shown; findings below may reference images not displayed]

FINDINGS: The heart size and mediastinal contours are within normal limits.
Both lungs are clear. No pneumothorax or pleural effusion is noted.
The visualized skeletal structures are unremarkable.
IMPRESSION: No active cardiopulmonary disease.

## 2018-10-15 IMAGING — CT CT MAXILLOFACIAL W/O CM
3 series · 16 of 47 positions shown, 19 images · non-contrast
Comparison: None.

CLINICAL DATA: Severe frontal headache x3 days with pain behind the
maxillary sinuses. Fever. History of breast cancer and asthma.
Suspect sinusitis.

EXAM:
CT MAXILLOFACIAL WITHOUT CONTRAST
TECHNIQUE: Multidetector CT imaging of the maxillofacial structures was
performed. Multiplanar CT image reconstructions were also generated.

[Series 2: max soft · axial · 0.32mm/px · z∈[+778,+914]mm · 10 of 80 slices shown, 13 images]
[im 6/80  brain]
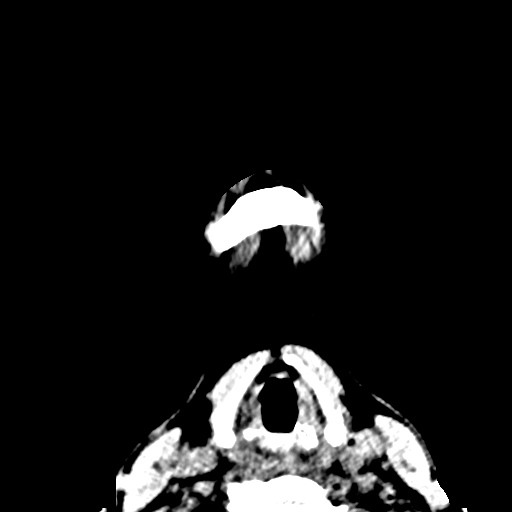
[im 6/80  bone]
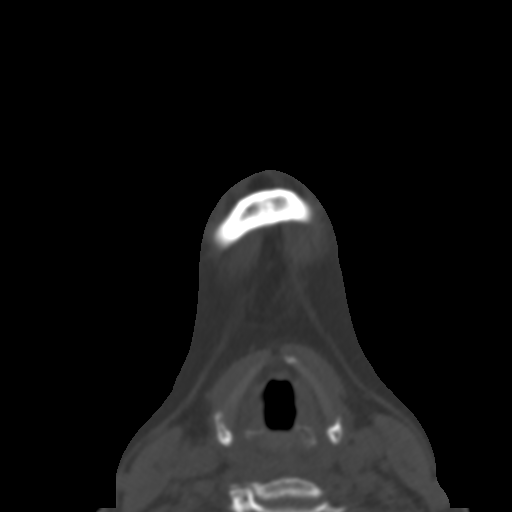
[im 14/80  bone]
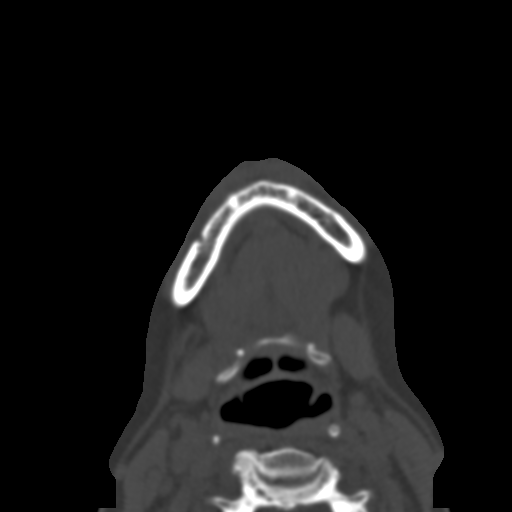
[im 22/80  bone]
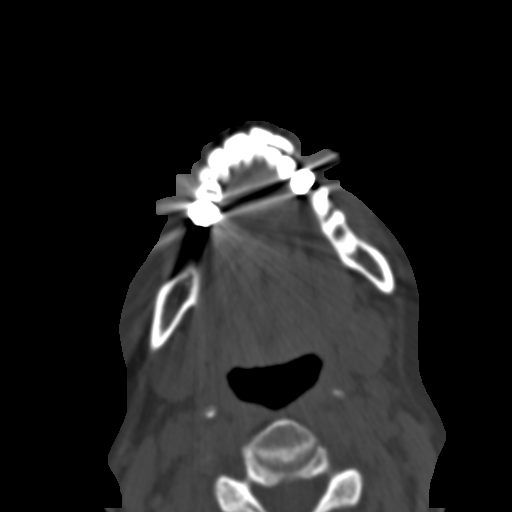
[im 28/80  bone]
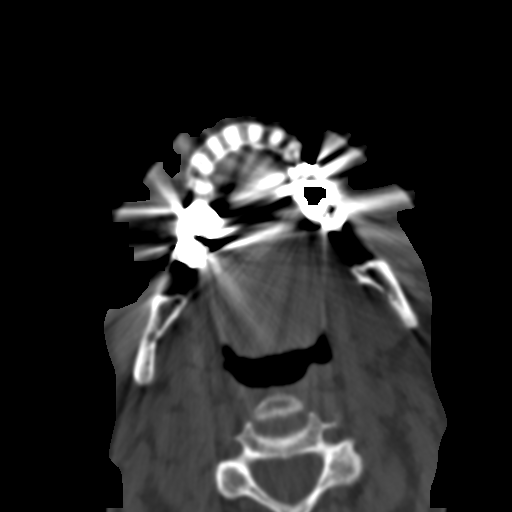
[im 36/80  brain]
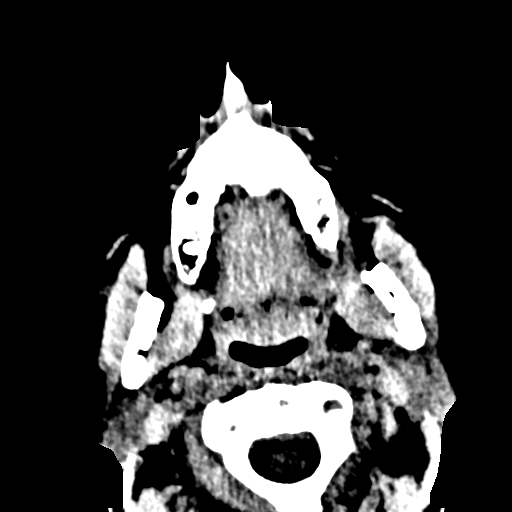
[im 36/80  bone]
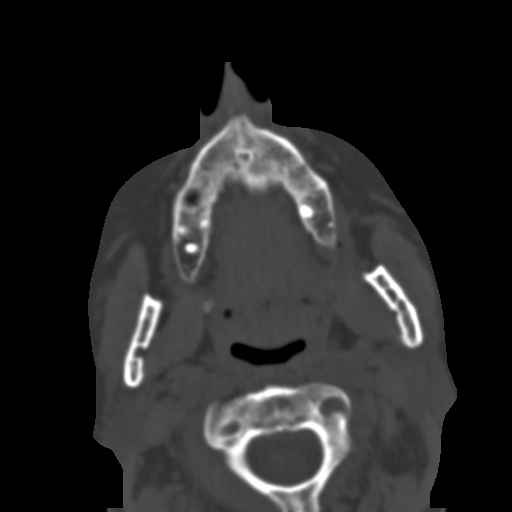
[im 44/80  bone]
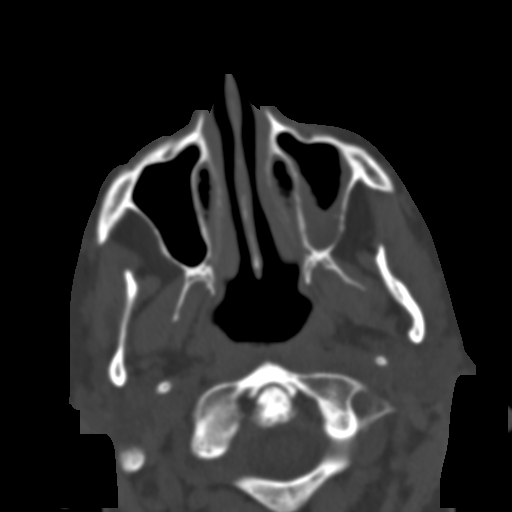
[im 52/80  bone]
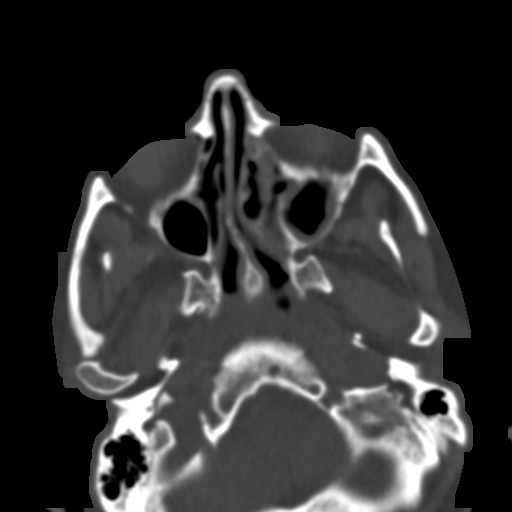
[im 60/80  bone]
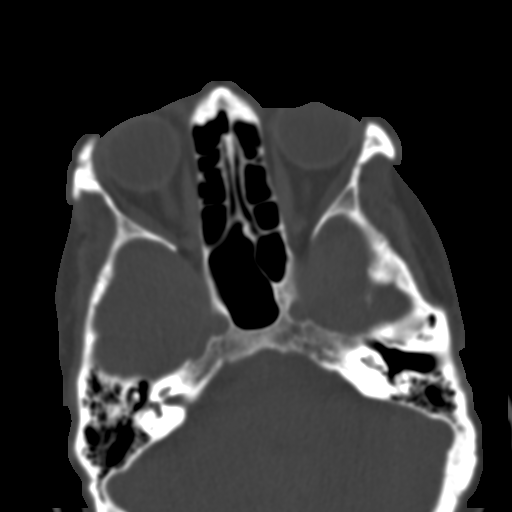
[im 66/80  brain]
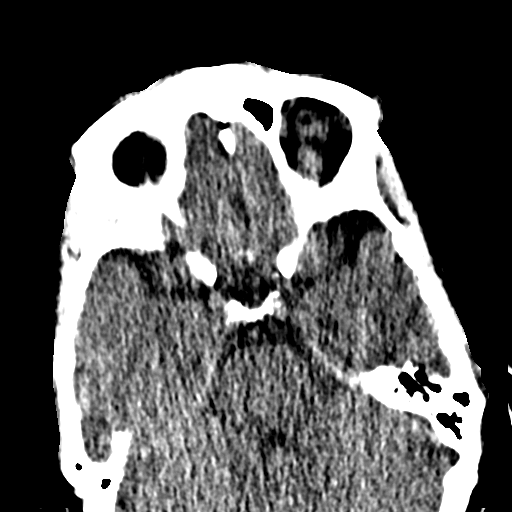
[im 66/80  bone]
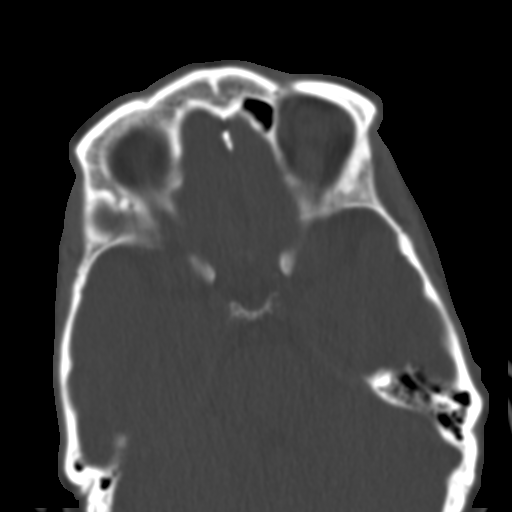
[im 74/80  bone]
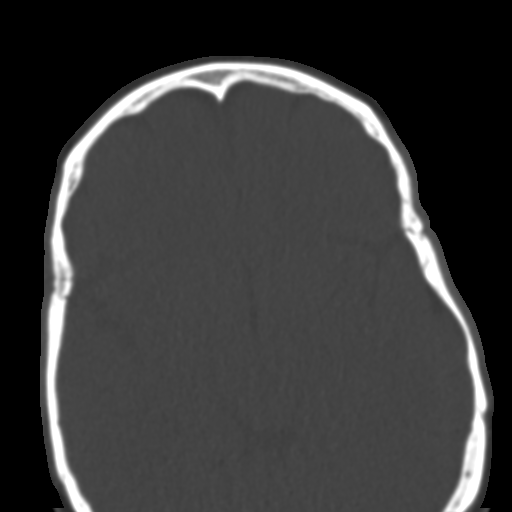

[Series 6: coronal soft · coronal · 0.33mm/px · 3 of 90 slices shown]
[im 30/90  bone]
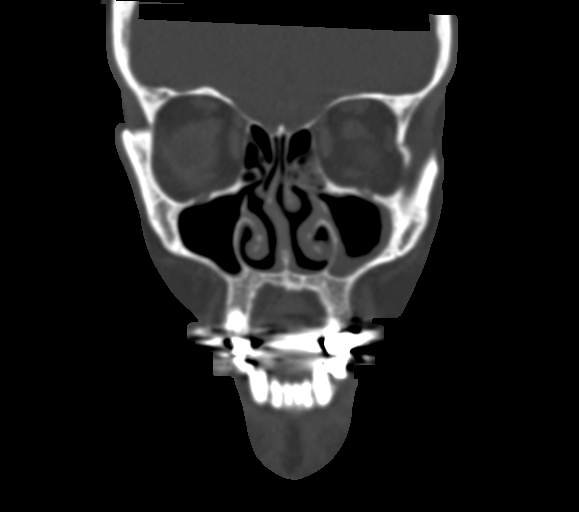
[im 40/90  bone]
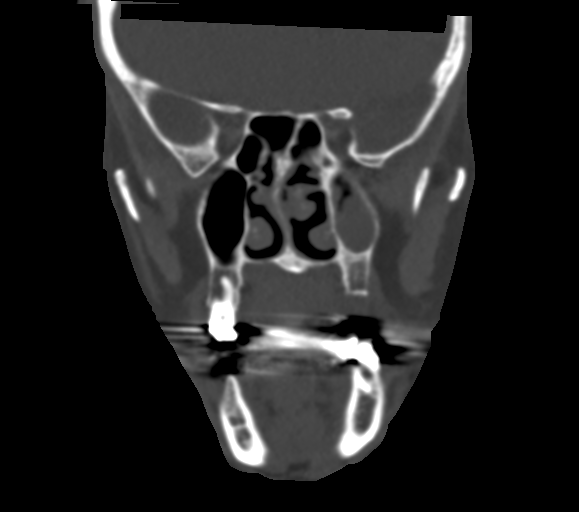
[im 50/90  bone]
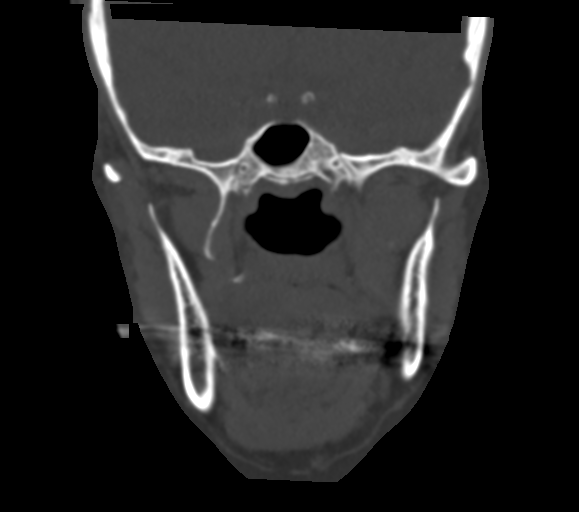

[Series 7: sagittal soft · sagittal · 0.33mm/px · 3 of 96 slices shown]
[im 32/96  bone]
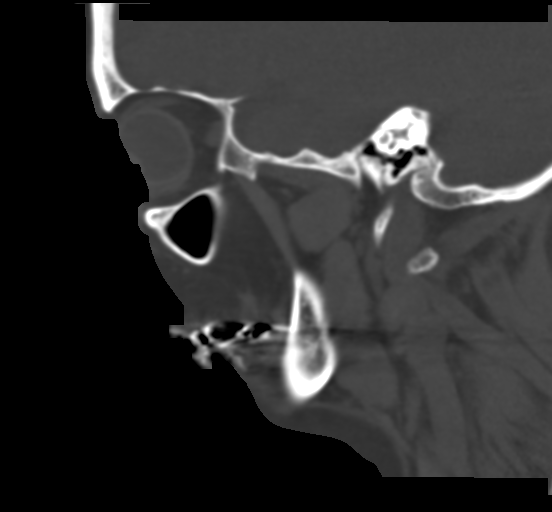
[im 48/96  bone]
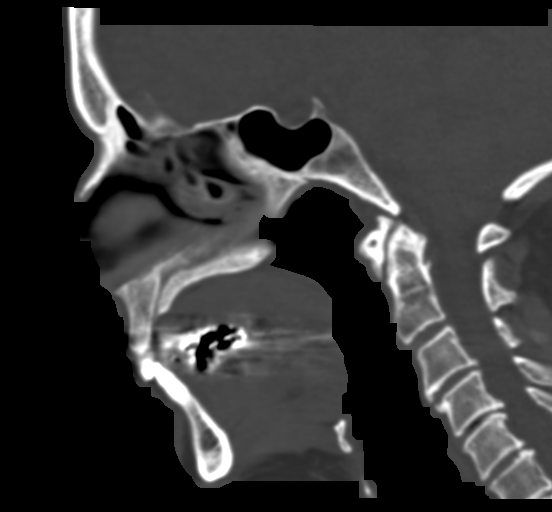
[im 64/96  bone]
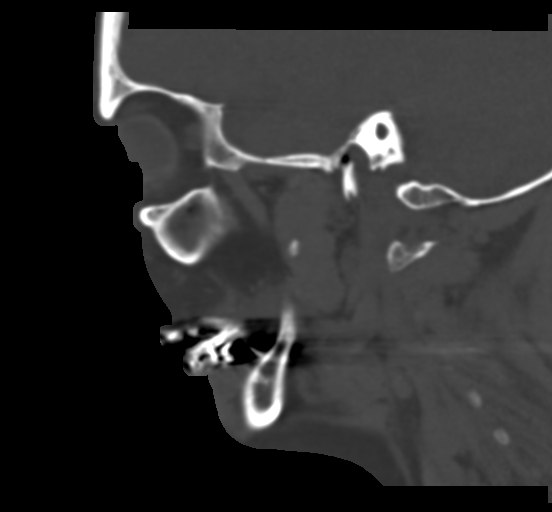

[16 of 47 positions shown; findings below may reference images not displayed]

FINDINGS: Osseous: No fracture or mandibular dislocation. No destructive
process. Mild dextroconvex curvature of the nasal septum.

Orbits: Negative. No traumatic or inflammatory finding.

Sinuses: Moderate circumferential mucosal thickening of the left
maxillary sinus without air-fluid levels. Underdeveloped frontal
sinus. Right maxillary, ethmoid and sphenoid sinuses are nonacute.

Soft tissues: Unremarkable

Limited intracranial: Nonacute
IMPRESSION: Moderate circumferential left maxillary sinus mucosal thickening
consistent with chronic sinusitis.

## 2019-03-13 ENCOUNTER — Other Ambulatory Visit: Payer: Self-pay | Admitting: Gastroenterology

## 2019-03-13 ENCOUNTER — Inpatient Hospital Stay: Admit: 2019-03-13 | Discharge: 2019-03-13 | Disposition: A | Discharge status: Home / Self Care | Payer: Self-pay

## 2019-08-27 ENCOUNTER — Encounter: Payer: Self-pay | Admitting: Gastroenterology

## 2019-09-05 ENCOUNTER — Other Ambulatory Visit: Payer: Self-pay | Admitting: Gastroenterology

## 2019-09-05 ENCOUNTER — Inpatient Hospital Stay: Admit: 2019-09-05 | Discharge: 2019-09-05 | Disposition: A | Discharge status: Home / Self Care | Payer: Self-pay

## 2019-11-01 ENCOUNTER — Encounter: Payer: Self-pay | Admitting: Gastroenterology

## 2019-11-15 ENCOUNTER — Inpatient Hospital Stay: Admit: 2019-11-15 | Discharge: 2019-11-15 | Disposition: A | Discharge status: Home / Self Care | Payer: Self-pay

## 2019-11-15 ENCOUNTER — Other Ambulatory Visit: Payer: Self-pay | Admitting: Gastroenterology

## 2020-04-21 ENCOUNTER — Inpatient Hospital Stay: Admit: 2020-04-21 | Discharge: 2020-04-21 | Disposition: A | Discharge status: Home / Self Care | Payer: Self-pay

## 2020-04-21 ENCOUNTER — Encounter: Payer: Self-pay | Admitting: Gastroenterology

## 2020-05-20 ENCOUNTER — Encounter: Payer: Self-pay | Admitting: Gastroenterology

## 2020-05-29 ENCOUNTER — Encounter: Payer: Self-pay | Admitting: Gastroenterology

## 2020-05-29 ENCOUNTER — Inpatient Hospital Stay: Admit: 2020-05-29 | Discharge: 2020-05-29 | Disposition: A | Discharge status: Home / Self Care | Payer: Self-pay

## 2020-05-30 ENCOUNTER — Encounter: Payer: Self-pay | Admitting: Gastroenterology

## 2020-06-04 ENCOUNTER — Encounter: Payer: Self-pay | Admitting: Gastroenterology

## 2020-06-05 ENCOUNTER — Inpatient Hospital Stay: Admit: 2020-06-05 | Discharge: 2020-06-05 | Disposition: A | Discharge status: Home / Self Care | Payer: Self-pay

## 2020-06-11 ENCOUNTER — Other Ambulatory Visit
Admission: RE | Admit: 2020-06-11 | Discharge: 2020-06-11 | Disposition: A | Discharge status: Home / Self Care | Payer: PRIVATE HEALTH INSURANCE | Source: Ambulatory Visit | Attending: Pathology | Admitting: Pathology

## 2020-06-11 DIAGNOSIS — C8331 Diffuse large B-cell lymphoma, lymph nodes of head, face, and neck: Secondary | ICD-10-CM

## 2020-06-11 DIAGNOSIS — D704 Cyclic neutropenia: Secondary | ICD-10-CM | POA: Insufficient documentation

## 2020-06-11 DIAGNOSIS — K121 Other forms of stomatitis: Secondary | ICD-10-CM

## 2020-06-13 LAB — SURGICAL PATHOLOGY

## 2020-06-19 ENCOUNTER — Other Ambulatory Visit: Payer: Self-pay

## 2020-06-23 ENCOUNTER — Other Ambulatory Visit: Payer: Self-pay

## 2020-06-24 ENCOUNTER — Ambulatory Visit: Payer: PRIVATE HEALTH INSURANCE | Attending: Hematology | Admitting: Hematology

## 2020-06-24 ENCOUNTER — Other Ambulatory Visit
Admission: RE | Admit: 2020-06-24 | Discharge: 2020-06-24 | Disposition: A | Discharge status: Home / Self Care | Payer: PRIVATE HEALTH INSURANCE | Source: Ambulatory Visit

## 2020-06-24 VITALS — BP 102/57 | HR 97 | Temp 97.3°F | Resp 18 | Ht 67.09 in | Wt 145.7 lb

## 2020-06-24 DIAGNOSIS — C9110 Chronic lymphocytic leukemia of B-cell type not having achieved remission: Secondary | ICD-10-CM | POA: Insufficient documentation

## 2020-06-24 NOTE — Progress Notes (Signed)
Blood and Marrow Transplant Program Clinic New Patient Note    CC: Evaluation for transplant    Oncologic History:  - 04/2008: T1cN0M0 ER/PR-, HER2+ invasive breast cancer DCIS; lymph node biopsy showing CLL  - 04/2008 through 05/2009: [facility] and herceptin  - 06/2010: Episode of erythema nodosum with rash, arthralgias, and weight loss - no recurrence, full resolution.  - Fall 2011: Increase in leukocytosis with CLL FISh panel positive for 13q deletion only  - 10/2010 through 01/2011: Rituxan/bendamustine for CLL  - 02/2014 through 06/11/14: Started on 4 cycles fludrabine, cyclophosphamide, and rituximab after developing worsening counts and progressive lymphadenopathy; course delayed by pancytopenias requiring hold. Repeat FISH showing ongoing 13q eletion and development of new 17p deletion.  - 08/29/14: Started on ibrutinib with stable disease.  - 06/23/17: Hospitalization with fever, nausea, and pancytopenia. BM biopsy showing 70% cells, mostly lymphocytes and 26% prolymphocytes. Deletion in 17p and 13q with new mutation in 11. CT scans showing adenopathy and splenomegaly. Started on venetoclax along with neulasta.  - 09/30/17: Rituxan added.  - 05/29/20: PET scan showing increase activity in the oral pharynx with submandibular lymph nodes, sized from 0.9 cm to 5.5cm on right side and 4.0 cm on left side. Supraclavicular lymph nodes bilaterally measuring up to 1.0 cm.  - 05/30/20: Biopsy of epiglottis lymph nodes showing DLBCL. Bone marrow showing no involvement.  - 06/05/20: Initiation of R-CHOP with plan for 3 cycles followed by RT.    Interval History: Patient currently on C1D20 of RCHOP.    PMH & Problem List  Reviewed in Epic.    Current Outpatient Medications   Medication Sig    levothyroxine (SYNTHROID, LEVOTHROID) 75 mcg tablet Take 75 mcg by mouth daily    tolterodine (DETROL) 1 MG tablet Take 1 mg by mouth daily    cetirizine (ZYRTEC) 10 mg tablet Take 10 mg by mouth daily    montelukast (SINGULAIR) 10 mg tablet  Take 10 mg by mouth nightly    multi-vitamin (MULTIVITAMIN) tablet Take 1 tablet by mouth daily     Social History     Socioeconomic History    Marital status: Married     Spouse name: Not on file    Number of children: Not on file    Years of education: Not on file    Highest education level: Not on file   Occupational History    Not on file   Tobacco Use    Smoking status: Not on file    Smokeless tobacco: Not on file   Substance and Sexual Activity    Alcohol use: Not on file    Drug use: Not on file    Sexual activity: Not on file   Social History Narrative    Not on file       Interval History: Patient accompanied by her sister, Joneen Boers.    Overall, patient has been tolerating chemotherapy, though she has had some side-effect - the main ones being headache and fatigue. She feels tired more easily, though she continues to stay active and do yardwork regularly. She continues to have symptoms associated with her larger lymph nodes of the oropharynx, including difficulty swallowing with some discomfort. She describes a feeling of a "food shelf" where it feels like some of the food she eats sits at the top of her throat. No choking episodes. With this, there is some discomfort radiating into her ears bilaterally. The entire area feels inflammed with "hotness," though the predisone is helping a lot.  Otherwise, she has a few positives on review of symptoms:  - Improvement in formerly chronic urinary symptoms with recent start of tolterodine.  - More frequent "charley horses" with muscle cramps  - Chronic back discomfort from known DJD    Other pertinent findings checked/documented below  _0  fevers  _1  palpitations _2  urinary frequency   _3  chills _4  chest pain _5  dysuria    _6  dry eyes _7  nausea/emesis  _8  rash   _9  ear pain _10  abdominal cramping _11  skin changes   _12  sores in mouth _13  abdominal discomfort _14  pain   _15  sore throat _16  loose stool, non-formed _17  fatigue   _18  cough  _19  diarrhea, watery  _20  enlarged lymph nodes   _21  sputum production  _22  weight loss _23  bleeding   _24  shortness of breath _25  insufficient oral intake _26  none of the above        Other clinical data  Review of EPIC data was performed including Laboratory, Pathology, Radiology and clinical records.     VS, weight, KPS  BP 102/57 (BP Location: Left arm, Patient Position: Sitting, Cuff Size: adult)    Pulse 97    Temp 36.3 C (97.3 F) (Temporal)    Resp 18    Ht 170.4 cm (5' 7.09")    Wt 66.1 kg (145 lb 11.6 oz)    SpO2 99%    BMI 22.76 kg/m     Wt Readings from Last 3 Encounters:   06/24/20 66.1 kg (145 lb 11.6 oz)     KPS%: 80-90    Physical Exam   General Appearance:  Mental status  No acute distress, non-toxic appearing. Wearing head covering and mask.  Normal affect, speech, dress, motor activity   Head:  Normocephalic, atraumatic   Eyes:  Conjunctiva/corneas clear, no eye drainage   Oral/Throat: Lips, mucosa, and tongue moist, no postpharyngeal exudate. LAD of cervical chain, supraclavicular LNs unable to be palpated.   Neck: Supple, normal turgor   Heart: S1, S2 normal, regular rhythm, no murmur, rub or gallop   Lungs:   Respirations unlabored, clear to A&P auscultation bilaterally   Abdomen:   Soft, non-tender, bowel sounds active all four quadrants, tympanic, no masses, no organomegaly   Extremities: No edema   Pulses: 2+ and symmetric   Skin: No rashes or lesions    Review of EPIC data performed including Laboratory, Pathology, Radiology and clinical records was performed.     Pathology:      Bone marrow biopsy 10/1:      Assessment and Plan  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 Patient is currently undergoing R-CHOP with plan for three cycles prior to radiation therapy to involved lymph nodes of oropharynx. Patient is referred to [place] by Dr. Jeannie Fend [facility]) for evaluation of transplant in the future, following  completion of planned radiation therapy.    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 She has excellent understanding of disease course to this point.    We discussed the treatment options for Richter Syndrome at the moment, which include her current regimen of R-chop. We went on to discuss the role of transplantation, the benefits of transplant as compared to continuing chemotherapy and vice versa. The hope would transplantation would be improved outcomes, as Richter transformed DLBCL is associated  with decreased response and survival as compared to disease not associated with CLL. The risks associated with transplant and long term quality of life expectations were introduced to the patient. Such a process would involve a conditioning regimen that can increase risk of bleeding and infection; risk of immunosuppressive agents and GVHD were briefly touched upon. We would likely use flu/mel conditioning, potentially in conjunction with total body irradiation. We did mention that TBI would not be affected by radiation therapy to submandibular nodes.    Today, we were also able to discuss logistics, including duration of stay in our BMT unit which would likely be approximately one month. This would not include the process of finding a suitable donor and eventual process of autologous vs allogenic stem cell collection in detail. We explained the process of allogenic stem cell collection which can happen either with course of G-CSF and subsequent apheresis of peripheral blood stem cells versus bone marrow harvest under anesthesia. Patient does have 3 possible siblings (2 female, 68 female) as well as 4 adult children (2 female, 2 female) though one of her sons is currently on treatment for ulcerative colitis. We will take this opportunity to perform HLA typing for patient and her sister who accompanies her  today.    The benefits and risks of allo- vs auto- transplants was discussed. At this time, we would favor allo-transplant given possibility of tumor cell contamination with autologous stem cell transplants. This is despite the certain benefits of autologous transplants, including decreased risk of infection and GVHD.    Patient to continue discussing with family and we will plan on bringing her back in three months time following completion of 3 rounds of R-CHOP and radiation therapy for further evaluation, discussion, and management. She will have restaging scans at that time, which will further aid our decision making. Patient and family met with our nurse coordinator for further information and educational materials.    Patient was seen and discussed with BMT attending Dr. [name]. Please await attestation for final recommendations.    Thereasa Parkin, MD  Hematology/Oncology Fellow, PGY4

## 2020-06-24 NOTE — Progress Notes (Deleted)
Blood and Marrow Transplant Program Clinic New Patient Note    CC: Evaluation for transplant    Oncologic History:  - 04/2008: TqcN0M0 ER/PR-, HER2+ inbasive breast cancer DCIS; lymph node biopsy showing CLL  - 04/2008 through 05/2009: [facility] and herceptin  - 06/2010: Episode of erythema nodosum with rash, arthralgias, and weight loss - no recurrence, full resolution.  - Fall 2011: Increase in leukocytosis with CLL FISh panel positive for 13q deletion only  - 10/2010 through 01/2011: Rituxan/bendamustine for CLL  - 02/2014 through 06/11/14: Started on 4 cycles fludrabine, cyclophosphamide, and rituximab after developing worsening counts and progressive lymphadenopathy; course delayed by pancytopenias requiring hold. Repeat FISH showing ongoing 13q eletion and development of new 17p deletion.  - 08/29/14: Started on ibrutinib with stable disease.  - 06/23/17: Hospitalization with fever, nausea, and pancytopenia. BM biopsy showing 70% cells, mostly lymphocytes and 26% prolymphocytes. Deletion in 17p and 13q with new mutation in 11. CT scans showing adenopathy and splenomegaly. Started on venetoclax along with neulasta.  - 09/30/17: Rituxan added.  - 05/29/20: PET scan showing increase activity in the oral pharynx with submandibular lymph nodes, sized from 0.9 cm to 5.5cm on right side and 4.0 cm on left side. Supraclavicular lymph nodes bilaterally measuring up to 1.0 cm.  - 05/30/20: Biopsy of epiglottis lymph nodes showing DLBCL. Bone marrow showing no involvement.  - 06/05/20: Initiation of R-CHOP with plan for 3 cycles followed by RT.    Interval History: Patient currently on C1D20 of RCHOP.    PMH & Problem List  Reviewed in Epic.    Current Outpatient Medications   Medication Sig    levothyroxine (SYNTHROID, LEVOTHROID) 75 mcg tablet Take 75 mcg by mouth daily    tolterodine (DETROL) 1 MG tablet Take 1 mg by mouth daily    cetirizine (ZYRTEC) 10 mg tablet Take 10 mg by mouth daily    montelukast (SINGULAIR) 10 mg tablet  Take 10 mg by mouth nightly    multi-vitamin (MULTIVITAMIN) tablet Take 1 tablet by mouth daily     Social History     Socioeconomic History    Marital status: Married     Spouse name: Not on file    Number of children: Not on file    Years of education: Not on file    Highest education level: Not on file   Occupational History    Not on file   Tobacco Use    Smoking status: Not on file    Smokeless tobacco: Not on file   Substance and Sexual Activity    Alcohol use: Not on file    Drug use: Not on file    Sexual activity: Not on file   Social History Narrative    Not on file       Interval History: Patient accompanied by her sister, Hannah Moore.    Overall, patient has been tolerating chemotherapy, though she has had some side-effects.      Other pertinent findings checked/documented below  '[]'  fevers  '[]'  palpitations '[]'  urinary frequency   '[]'  chills '[]'  chest pain '[]'  dysuria    '[x]'  dry eyes '[x]'  nausea/emesis  '[]'  rash   '[x]'  ear pain '[]'  abdominal cramping '[]'  skin changes   '[]'  sores in mouth '[]'  abdominal discomfort '[x]'  pain   '[x]'  sore throat '[]'  loose stool, non-formed '[x]'  fatigue   '[x]'  cough  '[]'  diarrhea, watery '[x]'  enlarged lymph nodes   '[]'  sputum production  '[x]'  weight loss '[]'  bleeding   [  x] shortness of breath '[x]'  insufficient oral intake '[]'  none of the above            Other clinical data  Review of EPIC data was performed including Laboratory, Pathology, Radiology and clinical records.     VS, weight, KPS  BP 102/57 (BP Location: Left arm, Patient Position: Sitting, Cuff Size: adult)    Pulse 97    Temp 36.3 C (97.3 F) (Temporal)    Resp 18    Ht 170.4 cm (5' 7.09")    Wt 66.1 kg (145 lb 11.6 oz)    SpO2 99%    BMI 22.76 kg/m     Wt Readings from Last 3 Encounters:   06/24/20 66.1 kg (145 lb 11.6 oz)       KPS%: ***    Physical Exam   General Appearance:  Mental status  No acute distress, non-toxic appearing  Normal affect, speech, dress, motor activity   Head:  Normocephalic, atraumatic   Eyes:   Conjunctiva/corneas clear, no eye drainage   Oral/Throat: Lips, mucosa, and tongue moist,   no postpharyngeal exudate   Neck: Supple, normal turgor   Heart: S1, S2 normal, regular rhythm, no murmur, rub or gallop   Lungs:   Respirations unlabored, clear to A&P auscultation bilaterally   Abdomen:   Soft, non-tender, bowel sounds active all four quadrants, tympanic, no masses, no organomegaly   Extremities: No edema   Pulses: 2+ and symmetric   Skin: No rashes or lesions    Review of EPIC data performed including Laboratory, Pathology, Radiology and clinical records was performed.     Pathology:      Bone marrow biopsy 10/1:      Assessment and Plan  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    R-CHOP vs DA-EPOCH to CR followed by non-myeloablative allogenic HCT?    Patient was seen and discussed with BMT attending Dr. [name].    Thereasa Parkin, MD  Hematology/Oncology Fellow, PGY4

## 2020-06-25 ENCOUNTER — Telehealth: Payer: Self-pay | Admitting: Hematology

## 2020-06-25 LAB — HLA FULL TYPING,BUCCAL

## 2020-06-25 LAB — HLA FULL TYPING STEM CELLS

## 2020-06-25 NOTE — Telephone Encounter (Signed)
Returned call to brother, Hannah Moore   Discussed tissue typing process- will ship kit

## 2020-06-25 NOTE — Telephone Encounter (Signed)
Attempted to call sister, Santiago Glad back   Asked her to call back to discuss tissue typing as she is able.

## 2020-06-27 LAB — SINGLE CLASS II ANTIBODY: SAB Result Class II: POSITIVE

## 2020-06-27 LAB — PRA ID CLASS I: PRA Percent Class I: 0

## 2020-06-27 LAB — PRA ID CLASS II: PRA Percent Class II: 45

## 2020-06-30 ENCOUNTER — Telehealth: Payer: Self-pay | Admitting: Hematology

## 2020-06-30 NOTE — Telephone Encounter (Signed)
Faxed per request.

## 2020-06-30 NOTE — Telephone Encounter (Signed)
Sister agrees to HLA testing, will request kit.

## 2020-07-03 ENCOUNTER — Telehealth: Payer: Self-pay | Admitting: Hematology

## 2020-07-03 ENCOUNTER — Other Ambulatory Visit
Admission: RE | Admit: 2020-07-03 | Discharge: 2020-07-03 | Disposition: A | Discharge status: Home / Self Care | Payer: PRIVATE HEALTH INSURANCE | Source: Ambulatory Visit | Attending: Hematology | Admitting: Hematology

## 2020-07-03 DIAGNOSIS — C9110 Chronic lymphocytic leukemia of B-cell type not having achieved remission: Secondary | ICD-10-CM | POA: Insufficient documentation

## 2020-07-03 NOTE — Telephone Encounter (Signed)
Friend inquiring if she should be tested.  Advised we don't test outside the immediate family, referred her to the be the match if she's interested in the international registry.

## 2020-07-03 NOTE — Telephone Encounter (Signed)
Noted  

## 2020-07-09 ENCOUNTER — Telehealth: Payer: Self-pay | Admitting: Hematology

## 2020-07-09 NOTE — Telephone Encounter (Signed)
Returned call to patient's sib, Santiago Glad   Confirmed we do have her hla tissue typing sample but that it is not processed yet, we will call her once it is available

## 2020-07-09 NOTE — Telephone Encounter (Signed)
Returned call to patient sib, karen  LM  Tissue typing is not resulted yet today our team will call her when this becomes available

## 2020-07-11 ENCOUNTER — Other Ambulatory Visit
Admission: RE | Admit: 2020-07-11 | Discharge: 2020-07-11 | Disposition: A | Discharge status: Home / Self Care | Payer: PRIVATE HEALTH INSURANCE | Source: Ambulatory Visit | Attending: Hematology | Admitting: Hematology

## 2020-07-11 ENCOUNTER — Ambulatory Visit: Payer: PRIVATE HEALTH INSURANCE | Attending: Hematology | Admitting: Hematology

## 2020-07-11 DIAGNOSIS — C9110 Chronic lymphocytic leukemia of B-cell type not having achieved remission: Secondary | ICD-10-CM | POA: Insufficient documentation

## 2020-07-15 ENCOUNTER — Other Ambulatory Visit
Admission: RE | Admit: 2020-07-15 | Discharge: 2020-07-15 | Disposition: A | Discharge status: Home / Self Care | Payer: PRIVATE HEALTH INSURANCE | Source: Ambulatory Visit

## 2020-07-15 ENCOUNTER — Ambulatory Visit: Payer: PRIVATE HEALTH INSURANCE | Attending: Hematology | Admitting: Hematology

## 2020-07-15 DIAGNOSIS — C9110 Chronic lymphocytic leukemia of B-cell type not having achieved remission: Secondary | ICD-10-CM | POA: Insufficient documentation

## 2020-07-18 ENCOUNTER — Telehealth: Payer: Self-pay | Admitting: Hematology

## 2020-07-18 NOTE — Telephone Encounter (Signed)
Informed patient that our financial counselor will obtain authorization pre BMT.   Patient stated understanding.

## 2020-07-22 ENCOUNTER — Other Ambulatory Visit
Admission: RE | Admit: 2020-07-22 | Discharge: 2020-07-22 | Disposition: A | Discharge status: Home / Self Care | Payer: PRIVATE HEALTH INSURANCE | Source: Ambulatory Visit | Attending: Hematology | Admitting: Hematology

## 2020-07-22 DIAGNOSIS — C9110 Chronic lymphocytic leukemia of B-cell type not having achieved remission: Secondary | ICD-10-CM | POA: Insufficient documentation

## 2020-08-04 ENCOUNTER — Telehealth: Payer: Self-pay | Admitting: Hematology

## 2020-08-04 NOTE — Telephone Encounter (Signed)
Confirmed with patient that she has two 10/10 URD samples confirmed.  Patient stated understanding.

## 2020-08-07 ENCOUNTER — Other Ambulatory Visit: Payer: Self-pay | Admitting: Gastroenterology

## 2020-08-07 ENCOUNTER — Inpatient Hospital Stay: Admit: 2020-08-07 | Discharge: 2020-08-07 | Disposition: A | Discharge status: Home / Self Care | Payer: Self-pay

## 2020-08-12 ENCOUNTER — Telehealth: Payer: Self-pay | Admitting: Hematology

## 2020-08-13 ENCOUNTER — Other Ambulatory Visit: Payer: Self-pay

## 2020-09-01 NOTE — Telephone Encounter (Signed)
Dr [name] left message with office.

## 2020-09-23 ENCOUNTER — Ambulatory Visit: Payer: PRIVATE HEALTH INSURANCE

## 2020-09-23 ENCOUNTER — Ambulatory Visit: Payer: PRIVATE HEALTH INSURANCE | Attending: Hematology | Admitting: Hematology

## 2020-09-23 VITALS — BP 108/61 | HR 88 | Temp 97.5°F | Resp 18 | Ht 67.09 in | Wt 149.4 lb

## 2020-09-23 DIAGNOSIS — C8580 Other specified types of non-Hodgkin lymphoma, unspecified site: Secondary | ICD-10-CM | POA: Insufficient documentation

## 2020-09-23 NOTE — Patient Instructions (Signed)
Pre transplant testing and appointments includes the following:  []  Pulmonary Function tests (to check volumes, gas and oxygen exchange)  []  Echocardiogram or MUGA--heart ultrasound to ensure your heart is functioning normally  []  Chest x-ray   []  EKG  []  Dental clearance--please make an appointment with your dentist and call to let us know               when and where that will take place so we can correspond with the dentist  []  Labs  []  Placement of a tunneled central venous catheter if you do not already have one  []  Appointment with transplant MD to review and sign consents  []  Bone marrow biopsy  []  Social work consult  []  Radiation consult          In addition, your insurer may require:  []  Colonoscopy results  []  For women: PAP testing within 2 years, Mammogram within 2 years  []  For men: PSA testing within 2 years  []  Other tests to be determined

## 2020-09-23 NOTE — Progress Notes (Signed)
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 She had just commenced chemoimmunotherapy at our first encounter.    In the interim, she completed three cycles of R-CHOP in a response-adapted fashion. PET3 suggested mixed response/partial response. There was questionable hypermetabolism of an inguinal lymph node which was therefore biopsied and not involved by high grade lymphoma.    She therefore proceeded with ISRT to the cervical nodes/Waldeyer ring. She began her second week of radiotherapy yesterday. She is expected to complete 25 fractions by February 18th. She is tolerating this well. She is diligent about mouth care and sialogogues.    The second and third cycles of chemoimmunotherapy were difficult. She was treated with intravenous fluid on a daily basis for two weeks at a time. She was given two red blood cell transfusions during her program. She was severely thrombocytopenic but never bled.    Her performance status is recovering. She is not exercising vigorously like she had been but is walking two to three miles per day five times per week. She does not have new cardiopulmonary, gastrointestinal, lower urinary tract, musculoskeletal, or cutaneous complaints.    She developed painful swelling in both calves which led to a finding of lower leg or popliteal deep vein thromboses by her description. She could not be anticoagulated in light of severe thrombocytopenia. An IVC filter was placed instead. The pain has spontaneously subsided.     Medications: Cetirizine. Docusate. Levothyroxine. Tolterodine. Montelukast. Multi-vitamin.    Physical Exam: Afebrile. Pulse 88. Blood  pressure 108/61. Oxygenation 100 percent. This is a well appearing female. She is oriented, attentive, and engaged. There is no scleral icterus. The oral cavity is clear. There is no palpable lymphadenopathy in node bearing areas. Regular rate and rhythm. The lungs are clear. The abdomen is soft and non-tender. The liver edge and spleen tip are not apparent. There is no peripheral fluid retention. The lower legs are symmetric and non-tender. There is no rash.    Diagnostics: We personally reviewed the PET from December 9th. There has been a decrease of avidity at the tongue base whereas there has been a marginal increase in avidity in the mediastinum, mesentery, and right groin. The latter was biopsied and not involved by high grade lymphoma.     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 She is amid response-adapted combined modality therapy for the latter. It seems she had a mixed response/partial response to three cycles of R-CHOP and the decision was made to proceed with involved site radiotherapy after infradiaphragmatic involvement was excluded. She is expected to conclude radiotherapy on February 18th. A treatment assessment will be necessary with metabolic imaging and bone marrow biopsy thereafter.    If she has a suitable response, consolidative reduced-intensity allogeneic hematopoietic stem cell transplant offers her the best odds of definitive long term control of both lymphomas. A salvage therapy for high grade lymphoma will be warranted otherwise. Anti-CD19 CAR T-cell therapy is not advisable in light of CLL. Richter transformed high grade B-cell lymphomas were excluded from these trials too. Two lines of therapy are required regardless and  the FDA has not made a determination on ZUMA-7 as of yet. Her medical fitness is satisfactory for reduced intensity  conditioning and there are no foreseeable contraindications to this either.    To that end, we introduced the general aspects of allogeneic transplant including donor selection, preparative chemotherapy regimens and their adverse event profiles, stem cell infusion, engraftmentand thefailure to do so, care during aplasia, vulnerability to opportunistic infections during that time, the long term risk of these thereafter, GVHD prophylaxis and manifestationsof acute and chronic forms of this, other complications and treatment-related mortality. The preparative regimen will likely entail fludarabine melphalan. We will determine the value of total body irradiation barring her next treatment assessment. Two matched unrelated donors are undergoing confirmatory typing.    With regard to the deep venous thrombosis, we would recommend IVC filter retrieval. It seems they spontaneously dissolved on the basis of her exam even in the absence of an anticoagulant. This can be confirmed with a repeat ultrasound of the legs. She could be anticoagulated now if the thrombus has not dissolved. We appreciate the opportunity to participate in her care and will see her back in March after the conclusion of radiotherapy and another treatment assessment.    This impression and plan was formulated with Dr. Assunta Curtis, attending hematologist.    Hannah Field, MD  Fellow

## 2020-09-23 NOTE — Progress Notes (Signed)
BMT PSYCHOSOCIAL EVALUATION    Nekeya Virrueta presents to the BMT clinic to meet with Dr. [name] for consideration of an allogeneic hematopoietic stem cell transplant for treatment of CLL/DLBCL.  She was accompanied to the appointment today by her sister.  Writer's contact with patient was via face-to-face PPE used: mask and eye protection.  Was patient masked during visit: Yes.      DEMOGRAPHICS: Ms. Hark is a 65 year old Caucasian female who is married with 4 children, and 5 grandchildren.  She has 6  siblings.  Both of her parents are deceased.    CITIZENSHIP:  [place]    RESIDENCE/HOUSING:   Ms. Gashi lives with her husband in [place], [place].  There are 5 steps to enter the home.  The bedroom and bathroom are on the first floor.  There are no concerns about her home setting.  There is a cat at home.  The patient has been educated about lodging options including the [place], and [employer].  The patient will access local lodging as needed.     TRANSPORTATION:  Ms. Arocho is independent with transportation at the moment.  In the event that she cannot provide for her own transportation family is available to assist.  There are no concerns about having access to transportation throughout the course of her treatment and recovery.    EDUCATION/EMPLOYMENT:  Ms. Soley is a high school, and college graduate.  She is a retired Marketing executive.  She has been educated about disability options including short term disability, long term disability, and Social Security Disability.      PRIMARY LANGUAGE:  English.    MENTAL STATUS CHANGES:  Ms. Garms is alert and oriented x3, and is easily engaged in conversation.  she is casually dressed, and is well groomed.  She denies any depressive symptoms or adjustment issues at this time.  She denies any past history with depression or other mental health concerns.  Social work will continue to follow providing  psychosocial support, and ongoing assessment of the patient's coping and adjustment.      DIAGNOSIS:  Ms. Pember is diagnosed with CLL/DLBCL, and is here for consideration of an allogeneic hematopoietic stem cell transplant.    CURRENT ADLs:  Ms. Calzada is currently independent with all of her ADL needs.  There is no indication that this should change significantly over the course of her treatment and recovery.  In the event that she may need additional assistance her family is available and willing to help.      HOBBIES/INTERESTS:  Ms. Mudry enjoys the following hobbies reading, puzzles, and walks.    INSURANCE:  Ms. Kosar has health insurance coverage through [employer] - switching to Medicare and a supplement.  She  does not have any concerns about coverage.  She has been educated about resources that may be available to her through the [facility], and the [facility].  The patient will follow up with social work for referral to programs and supports as needed.    PRIMARY SUPPORTS:  Ms. Corbo identifies her primary support to be derived from her husband, and her sister.  She has additional supports available through family, friends, and community.    SPIRITUALITY/BELIEFS:  Ms. Kooi states that spirituality plays a significant role in her coping and adjustment with this illness.    ACTIONS:  Ms. Lechuga has a health care proxy on file in  her electronic medical record.      FINANCIAL COORDINATOR:  Leontine Locket    BMT NURSE COORDINATOR:  [name], RN    IMPRESSIONS:  Agnes Lawrence Verdi presents to the BMT clinic to meet with Dr. [name] for consideration of an allogeneic hematopoietic stem cell transplant for treatment of CLL/DLBCL.  She was accompanied to the appointment today by her sister.  Ms. Frieze is a 66 year old Caucasian female who is married with 4 children, and 5 grandchildren.  She  has 6  siblings.     She lives with her husband in [place], [place].  There are 5 steps to enter the home.  The bedroom and bathroom are on the first floor.  There are no concerns about her home setting.  There is a cat at home.  The patient has been educated about lodging options including the [place], and [employer].  The patient will access local lodging as needed.  Ms. Servello is independent with transportation at the moment.  In the event that she cannot provide for her own transportation family is available to assist.  There are no concerns about having access to transportation throughout the course of her treatment and recovery.    Ms. Worthington is alert and oriented x3, and is easily engaged in conversation.  she is casually dressed, and is well groomed.  She denies any depressive symptoms or adjustment issues at this time.  She denies any past history with depression or other mental health concerns.  Social work will continue to follow providing psychosocial support, and ongoing assessment of the patient's coping and adjustment.      She has been educated about resources that may be available to her.  These resources include, but are not limited to the [facility], B[facility] [employer], the [facility] Financial Assistance Program, and the Social Security Administration.  Should any needs arise prior to her next visit please notify social work so that I may provide assistance at that time.       Ms. Nickelson presents for consideration of an allogeneic stem cell transplant.  She is alert and oriented x3, and is fully engaged in the discussion.  She states that she is managing well emotionally at this time.  She reports having good support from her faith and church community.  She appears to have good support from her family.  There are no concerns from either the patient or  her sister about their ability to mobilize support throughout the course of her treatment and recovery.  Social work will continue to follow providing psychosocial support, resource mobilization, and ongoing assessment of her coping and adjustment.  Should any needs arise prior to her next visit please notify social work so that I may provide assistance at that time.       Marita Snellen, LMSW  Blood & Marrow Transplant Social Worker  [phone]

## 2020-09-25 ENCOUNTER — Other Ambulatory Visit: Payer: Self-pay | Admitting: Gastroenterology

## 2020-11-10 ENCOUNTER — Inpatient Hospital Stay: Admit: 2020-11-10 | Discharge: 2020-11-10 | Disposition: A | Discharge status: Home / Self Care | Payer: Self-pay

## 2020-11-10 ENCOUNTER — Other Ambulatory Visit: Payer: Self-pay | Admitting: Gastroenterology

## 2020-11-11 ENCOUNTER — Other Ambulatory Visit
Admission: RE | Admit: 2020-11-11 | Discharge: 2020-11-11 | Disposition: A | Discharge status: Home / Self Care | Payer: PRIVATE HEALTH INSURANCE | Source: Ambulatory Visit

## 2020-11-11 ENCOUNTER — Ambulatory Visit: Payer: PRIVATE HEALTH INSURANCE | Attending: Hematology | Admitting: Hematology

## 2020-11-11 ENCOUNTER — Other Ambulatory Visit: Payer: Self-pay

## 2020-11-11 VITALS — BP 96/49 | HR 90 | Temp 96.4°F | Resp 15 | Ht 67.09 in | Wt 132.7 lb

## 2020-11-11 DIAGNOSIS — C9110 Chronic lymphocytic leukemia of B-cell type not having achieved remission: Secondary | ICD-10-CM

## 2020-11-11 LAB — ABO/RH: ABO RH Blood Type: A POS

## 2020-11-11 NOTE — Patient Instructions (Addendum)
Pre transplant testing and appointments includes the following:  _0   Pulmonary Function tests (to check volumes, gas and oxygen exchange)  _1   Echocardiogram or MUGA--heart ultrasound to ensure your heart is functioning normally  _2   Chest x-ray   _3   EKG  _4   Dental clearance--please make an appointment with your dentist and call to let us know               when and where that will take place so we can correspond with the dentist  _5   Labs  _6   Placement of a tunneled central venous catheter if you do not already have one  _7   Appointment with transplant MD to review and sign consents  _8   Bone marrow biopsy ? Na ?  _9   Social work consult  _10   Radiation consult ? NA          In addition, your insurer may require:  _11   Colonoscopy results  _12   For women: PAP testing within 2 years, Mammogram within 2 years  _13   For men: PSA testing within 2 years  _14   Other tests to be determined

## 2020-11-12 ENCOUNTER — Telehealth: Payer: Self-pay | Admitting: Hematology

## 2020-11-12 LAB — HEPATITIS C RNA, QUANTITATIVE, PCR
HCV PCR log: NOT DETECTED IU/mL
HCV PCR: NOT DETECTED IU/mL

## 2020-11-12 LAB — HEPATITIS A IGG AB: Hepatitis A IGG: NEGATIVE

## 2020-11-12 LAB — HEPATITIS B & C PROF
HBV Core Ab: NEGATIVE
HBV S Ab Quant: 10.36 m[IU]/mL
HBV S Ag: NEGATIVE
Hep C Ab: NEGATIVE

## 2020-11-12 LAB — BB PROTOCOL REVIEW

## 2020-11-12 LAB — BLOOD BANK TRANSFUSION

## 2020-11-12 NOTE — Progress Notes (Signed)
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 Florene Glen.     Oncologic History:  - 04/2008: T1cN0M0 ER/PR-, HER2+ invasive breast cancer DCIS; lymph node biopsy showing CLL  - 04/2008 through 05/2009: [facility] and herceptin  - 06/2010: Episode of erythema nodosum with rash, arthralgias, and weight loss - no recurrence, full resolution.  - Fall 2011: Increase in leukocytosis with CLL FISh panel positive for 13q deletion only  - 10/2010 through 01/2011: Rituxan/bendamustine for CLL  - 02/2014 through 06/11/14: Started on 4 cycles fludrabine, cyclophosphamide, and rituximab after developing worsening counts and progressive lymphadenopathy; course delayed by pancytopenias requiring hold. Repeat FISH showing ongoing 13q eletion and development of new 17p deletion.  - 08/29/14: Started on ibrutinib with stable disease.  - 06/23/17: Hospitalization with fever, nausea, and pancytopenia. BM biopsy showing 70% cells, mostly lymphocytes and 26% prolymphocytes. Deletion in 17p and 13q with new mutation in 11. CT scans showing adenopathy and splenomegaly. Started on venetoclax along with neulasta.  - 09/30/17: Rituxan added.  - 05/29/20: PET scan showing increase activity in the oral pharynx with submandibular lymph nodes, sized from 0.9 cm to 5.5cm on right side and 4.0 cm on left side. Supraclavicular lymph nodes bilaterally measuring up to 1.0 cm.  - 05/30/20: Biopsy of epiglottis lymph nodes showing DLBCL. Bone marrow showing no involvement, but 50% of the marrow had CLL involvement. .  - 06/05/20: Initiation of R-CHOP    Interval History/Review of System: Hannah Moore returns in scheduled follow-up, in the company of her sister, to revisit therapeutic options for heavily treated CLL and newly diagnosed Richter transformed DLBCL. She recently completed 5 weeks of radiation to the left  palatine tonsil.     She completed three cycles of R-CHOP in a response-adapted fashion. PET3 suggested mixed response/partial response. There was questionable hypermetabolism of an inguinal lymph node which was therefore biopsied and not involved by high grade lymphoma. She therefore proceeded with ISRT to the cervical nodes/Waldeyer ring.  She completed 25 fractions February 18th.      She has lost a lot of weight (154 to 132). Marland Kitchen   Her mouth is just starting to heal so that she can eat and swallow well.   No fevers, chlls, sweats   No other new adenopathy.   No diarrhea or abdominal pain.   The leg edema is better and IVC filter is out.     Current Outpatient Medications   Medication Sig Dispense Refill    levothyroxine (SYNTHROID, LEVOTHROID) 75 mcg tablet Take 75 mcg by mouth daily      tolterodine (DETROL) 1 MG tablet Take 1 mg by mouth daily      cetirizine (ZYRTEC) 10 mg tablet Take 10 mg by mouth daily      montelukast (SINGULAIR) 10 mg tablet Take 10 mg by mouth nightly      multi-vitamin (MULTIVITAMIN) tablet Take 1 tablet by mouth daily (Patient not taking: Reported on 11/11/2020)       No current facility-administered medications for this visit.     ROS: Otherwise negaitve.     Vitals:    11/11/20 1331   BP: (!) 96/49   Pulse: 90   Resp: 15   Temp: 35.8 C (96.4 F)   Weight: 60.2 kg (132 lb 11.5 oz)   Height: 170.4 cm (5' 7.09")     Note weight was  149 pounds on 1/25.       Physical Exam:   Thn but well-appearing. Well-groomed.   No icterus  No asymmetry of posterior pharynx noted. No oral ulcers or exudates  No nodes in cervical supraclavicular, axillary regions; firm left axilla (most likely scar tissue). No inguinal nodes.   Lungs are clear to A&P  Cor with normal S1, S2; no S3, S4 or murmur  Abdomen soft and nontender with no hepatomegaly or splenomegaly detected.   No leg edema.   No rash.  Port intact     PET from 3/14:  Left palantine tonsil with lesion at 11.4 SUV (previously 9); appears to  have grown  One noed in chest .9X2.2 (SUV 4.2)  Some liver lesions with  SUV of 7.4; no corresponding lesions on CT.  Images reviewed --the lesions are diffuse throughout the liver.     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 She is amid response-adapted combined modality therapy for the latter. It seems she had a mixed response/partial response to three cycles of R-CHOP and the decision was made to proceed with involved site radiotherapy after infradiaphragmatic involvement was excluded. She has now completed radiation therapy to the [facility] ring and cervical area. The PET done about 30 days after completion shows some residual hypermetabolic uptake in the left palatine tonsil and new lesions in the liver which interestingly do not correlate with any CT findings.     Before we proceed with plans for stem cell transplant, it will be important to know if these areas of uptake represent onging large cell lymphoma, CLL involvement, or another process. She will have ENT followup and perhaps another imaging modality will be able to define better the hepatic process in play.      In the meantime, we will start ECHO and PFT assessment in anticipation of transplant.  There are two young female 10/10 donors who have completed confirmatory typing, and we will begin donor workup for one of them. As of now, allografting performed at a time the large cell component is in remission will offer the best chance of long term response.     Today, we again touched on  preparative chemotherapy regimens and their adverse event profiles, stem cell infusion, engraftment, care during aplasia, vulnerability to opportunistic infections during that time, the long term risk of these thereafter, GVHD prophylaxis and manifestationsof acute and chronic forms of this, other complications and  treatment-related mortality. The preparative regimen will likely entail fludarabine and melphalan.     S/P popliteal DVT--IVC filter has now been removed.     I will follow up with Dr. Jeannie Fend as she has a visit in that office on 3/17.   We will try to schedule the PFts and ECHO locally If she needs salvage herapy for the large cell lymphoma, can consider R-GDP or RICE. We can discuss that later as appropriate.       We will obtain her recent blood counts and chemistries from [facility].     Cheron Schaumann, MD

## 2020-11-13 ENCOUNTER — Telehealth: Payer: Self-pay | Admitting: Hematology

## 2020-11-13 NOTE — Telephone Encounter (Signed)
Noted  

## 2020-11-14 LAB — BMT PRE-RECIPIENT TESTING
CMV IgG: POSITIVE
EBV VCA IgG: 1.4 AI — ABNORMAL HIGH
HIV 1&2 ANTIGEN/ANTIBODY: NONREACTIVE
HSV 1 IgG: <0.2 AI
HSV 2 IgG: <0.2 AI
HTLV I/II Ab: NONREACTIVE
Parvovirus B19 IgG: POSITIVE
Parvovirus B19 IgM: NEGATIVE
Syphilis Screen: NEGATIVE
Syphilis Status: NONREACTIVE
Toxoplasma IgG: 0 IU/mL
VZV IgG: POSITIVE

## 2020-11-25 ENCOUNTER — Inpatient Hospital Stay: Admit: 2020-11-25 | Discharge: 2020-11-25 | Disposition: A | Discharge status: Home / Self Care | Payer: Self-pay

## 2020-11-25 ENCOUNTER — Other Ambulatory Visit: Payer: Self-pay | Admitting: Gastroenterology

## 2020-11-27 ENCOUNTER — Inpatient Hospital Stay: Admit: 2020-11-27 | Discharge: 2020-11-27 | Disposition: A | Discharge status: Home / Self Care | Payer: Self-pay

## 2020-11-27 ENCOUNTER — Other Ambulatory Visit: Payer: Self-pay | Admitting: Gastroenterology

## 2020-11-28 ENCOUNTER — Encounter: Payer: Self-pay | Admitting: Gastroenterology

## 2020-12-02 ENCOUNTER — Inpatient Hospital Stay: Admit: 2020-12-02 | Discharge: 2020-12-02 | Disposition: A | Discharge status: Home / Self Care | Payer: Self-pay

## 2020-12-02 ENCOUNTER — Other Ambulatory Visit: Payer: Self-pay | Admitting: Gastroenterology

## 2020-12-03 ENCOUNTER — Encounter: Payer: Self-pay | Admitting: Hematology

## 2020-12-08 ENCOUNTER — Telehealth: Payer: Self-pay | Admitting: Hematology

## 2020-12-09 ENCOUNTER — Telehealth: Payer: Self-pay | Admitting: Hematology

## 2020-12-10 ENCOUNTER — Telehealth: Payer: Self-pay | Admitting: Hematology

## 2020-12-10 NOTE — Telephone Encounter (Signed)
Returned call to Dr Julious Payer office   They state Dr [name] already returned call to Dr Julious Payer team   No further needs

## 2020-12-17 ENCOUNTER — Inpatient Hospital Stay: Admit: 2020-12-17 | Discharge: 2020-12-17 | Disposition: A | Discharge status: Home / Self Care | Payer: Self-pay

## 2020-12-17 ENCOUNTER — Other Ambulatory Visit: Payer: Self-pay | Admitting: Gastroenterology

## 2021-01-12 ENCOUNTER — Encounter: Payer: Self-pay | Admitting: Gastroenterology

## 2021-01-14 ENCOUNTER — Encounter: Payer: Self-pay | Admitting: Hematology

## 2021-01-14 NOTE — Telephone Encounter (Signed)
Error

## 2021-01-15 ENCOUNTER — Telehealth: Payer: Self-pay | Admitting: Oncology

## 2021-01-15 NOTE — Telephone Encounter (Signed)
Spoke with Margaretha Sheffield  She will send over current records- we reviewed what is missing from our files- fax number given  01/21/21 at 11 am  She asks that I reach out to patient  With appointment day and time which I agree to do  States understanding    Called Jahniya  She is happy that Dr Aneta Mins will see her and looks forward to meeting him.  5/25 at 11 am  States understanding

## 2021-01-16 ENCOUNTER — Telehealth: Payer: Self-pay | Admitting: Oncology

## 2021-01-17 ENCOUNTER — Other Ambulatory Visit: Payer: Self-pay

## 2021-01-19 NOTE — Progress Notes (Signed)
Lymphoma New Patient Pathway   REGISTRATION:    Date of referral: 01/14/2021   Referring provider to Medical Oncology: Other   Other referring provider: Dr Hannah Moore > Dr Hannah Moore >Dr Hannah Moore for CART evaluation   Diagnosis: Richters transformation to DLBCL   Appointment date: 01/21/2021   Discipline: Medical Oncology   Nurse Navigator encounter type: Chart Review            County:  [place]    Referred by:  Dr Hannah Moore  Dr Hannah Moore      Pertinent notes:    01/13/21 Hannah Proud FNP / Dr Hannah Moore                  09/23/20 Dr Hannah Moore Office Note [place] signed by Hannah Curtis, MD at 09/27/2020 10:44 AM   I saw and evaluated Hannah Moore with Dr. Naaman Moore and concur with his accompanying note.  Given the Richter's transformation and active CLL, we will continue to pursue allogeneic transplant options with her. Agree with IVC removal when feasible.  WE will see her back in March to begin the transplant pre-testing if large cell lymphoma component remains with good response.   Other plans per accomanying note .  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 She had just commenced chemoimmunotherapy at our first encounter.    In the interim, she completed three cycles of R-CHOP in a response-adapted fashion. PET3 suggested mixed response/partial response. There was questionable hypermetabolism of an inguinal lymph node which was therefore biopsied and not involved by high grade lymphoma.    She therefore proceeded with ISRT to the cervical nodes/Waldeyer ring. She began her second week of radiotherapy yesterday. She is expected to  complete 25 fractions by February 18th. She is tolerating this well. She is diligent about mouth care and sialogogues.    The second and third cycles of chemoimmunotherapy were difficult. She was treated with intravenous fluid on a daily basis for two weeks at a time. She was given two red blood cell transfusions during her program. She was severely thrombocytopenic but never bled.    Her performance status is recovering. She is not exercising vigorously like she had been but is walking two to three miles per day five times per week. She does not have new cardiopulmonary, gastrointestinal, lower urinary tract, musculoskeletal, or cutaneous complaints.    She developed painful swelling in both calves which led to a finding of lower leg or popliteal deep vein thromboses by her description. She could not be anticoagulated in light of severe thrombocytopenia. An IVC filter was placed instead. The pain has spontaneously subsided.     Medications: Cetirizine. Docusate. Levothyroxine. Tolterodine. Montelukast. Multi-vitamin.    Physical Exam: Afebrile. Pulse 88. Blood pressure 108/61. Oxygenation 100 percent. This is a well appearing female. She is oriented, attentive, and engaged. There is no scleral icterus. The oral cavity is clear. There is no palpable lymphadenopathy in node bearing areas. Regular rate and rhythm. The lungs are clear. The abdomen is soft and non-tender. The liver edge and spleen tip are not  apparent. There is no peripheral fluid retention. The lower legs are symmetric and non-tender. There is no rash.    Diagnostics: We personally reviewed the PET from December 9th. There has been a decrease of avidity at the tongue base whereas there has been a marginal increase in avidity in the mediastinum, mesentery, and right groin. The latter was biopsied and not involved by high grade lymphoma.     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 She is amid response-adapted combined modality therapy for the latter. It seems she had a mixed response/partial response to three cycles of R-CHOP and the decision was made to proceed with involved site radiotherapy after infradiaphragmatic involvement was excluded. She is expected to conclude radiotherapy on February 18th. A treatment assessment will be necessary with metabolic imaging and bone marrow biopsy thereafter.    If she has a suitable response, consolidative reduced-intensity allogeneic hematopoietic stem cell transplant offers her the best odds of definitive long term control of both lymphomas. A salvage therapy for high grade lymphoma will be warranted otherwise. Anti-CD19 CAR T-cell therapy is not advisable in light of CLL. Richter transformed high grade B-cell lymphomas were excluded from these trials too. Two lines of therapy are required regardless and the FDA has not made a determination on ZUMA-7 as of yet. Her medical fitness is satisfactory for reduced intensity conditioning and there are no foreseeable contraindications to this either.    To that end, we introduced the general aspects of allogeneic transplant including donor selection, preparative chemotherapy regimens and their adverse event profiles, stem cell infusion, engraftmentand thefailure to do so, care during aplasia, vulnerability toopportunisticinfections during that time, the long term risk of these thereafter, GVHD prophylaxis and manifestationsof acute and chronic forms of this, other complications and treatment-related mortality. The preparative regimen will likely entail fludarabine melphalan. We will determine the value of total body irradiation barring her next treatment assessment. Two matched unrelated donors are undergoing confirmatory typing.    With regard to the deep venous thrombosis, we would recommend IVC filter retrieval.  It seems they spontaneously dissolved on the basis of her exam even in the absence of an anticoagulant. This can be confirmed with a repeat ultrasound of the legs. She could be anticoagulated now if the thrombus has not dissolved. We appreciate the opportunity to participate in her care and will see her back in March after the conclusion of radiotherapy and another treatment assessment.    This impression and plan was formulated with Dr. Assunta Moore, attending hematologist.    Dianah Field, MD  Fellow                            IMAGING:      12/17/20  CT head  Media      11/10/20 PET Media    09/25/20 Ultrasound Lower Extremities [facility] Media    05/29/20 PET  [facility] Media    08/07/20 PET [facility] Media    06/06/20 MRI  [facility]                            PATHOLOGY:  06/11/20 Second Opinion [facility]  FINAL DIAGNOSIS:   INSTITUTIONAL CONSULTATION   Outside material received from [facility], [place], [place].   (  [id number], 05/20/2020):   Part 1: Tongue base biopsy.   - DIFFUSE LARGE B-CELL LYMPHOMA, See comment.     Part 2: Epiglottis biopsy   - Squamous mucosa with chronic inflammation. No dysplasia or   malignancy seen.     Peripheral blood smear, bone marrow aspirate, core biopsy and clot   ([id number], 05/30/2020):   - Normocellular bone marrow with maturing trilineage hematopoiesis.   - a low grade lymphoma comprises about 50% of the marrow cellularity,   see comment     Comment:   No phenotypic studies were provided with the marrow but the morphology is compatible with CLL. Given the findings in the marrow, a history of CLL, and reported detection of CD5 by flow cytometry in the large cell lymphoma, the findings are consistent with "Richter's transformation".   I am aware that the original pathology report suggests a cell-of-origin   classification, that classification schema is not relevant to CLL in   transformation.        LABS:                      OTHER:  05/22/2008 - breast Cancer - high grade ductal carcinoma in situ multi-focal- Mastectomy

## 2021-01-20 ENCOUNTER — Other Ambulatory Visit
Admission: RE | Admit: 2021-01-20 | Discharge: 2021-01-20 | Disposition: A | Discharge status: Home / Self Care | Payer: PRIVATE HEALTH INSURANCE | Source: Ambulatory Visit | Attending: Pathology | Admitting: Pathology

## 2021-01-20 DIAGNOSIS — C9110 Chronic lymphocytic leukemia of B-cell type not having achieved remission: Secondary | ICD-10-CM

## 2021-01-21 ENCOUNTER — Other Ambulatory Visit
Admission: RE | Admit: 2021-01-21 | Discharge: 2021-01-21 | Disposition: A | Discharge status: Home / Self Care | Payer: PRIVATE HEALTH INSURANCE | Source: Ambulatory Visit

## 2021-01-21 ENCOUNTER — Ambulatory Visit: Payer: PRIVATE HEALTH INSURANCE | Attending: Oncology | Admitting: Oncology

## 2021-01-21 ENCOUNTER — Ambulatory Visit: Payer: PRIVATE HEALTH INSURANCE

## 2021-01-21 VITALS — BP 96/56 | HR 102 | Temp 97.2°F | Resp 10 | Ht 67.32 in | Wt 116.4 lb

## 2021-01-21 DIAGNOSIS — C8330 Diffuse large B-cell lymphoma, unspecified site: Secondary | ICD-10-CM | POA: Insufficient documentation

## 2021-01-21 LAB — PHOSPHORUS: Phosphorus: 3.6 mg/dL (ref 2.7–4.5)

## 2021-01-21 LAB — COMPREHENSIVE METABOLIC PANEL
ALT: 28 U/L (ref 0–35)
AST: 32 U/L (ref 0–35)
Albumin: 4.1 g/dL (ref 3.5–5.2)
Alk Phos: 200 U/L — ABNORMAL HIGH (ref 35–105)
Anion Gap: 15 (ref 7–16)
Bilirubin,Total: 0.5 mg/dL (ref 0.0–1.2)
CO2: 25 mmol/L (ref 20–28)
Calcium: 10.2 mg/dL (ref 8.6–10.2)
Chloride: 98 mmol/L (ref 96–108)
Creatinine: 1.15 mg/dL — ABNORMAL HIGH (ref 0.51–0.95)
Glucose: 119 mg/dL — ABNORMAL HIGH (ref 60–99)
Lab: 23 mg/dL — ABNORMAL HIGH (ref 6–20)
Potassium: 4.6 mmol/L (ref 3.3–5.1)
Sodium: 138 mmol/L (ref 133–145)
Total Protein: 5.8 g/dL — ABNORMAL LOW (ref 6.3–7.7)
eGFR BY CREAT: 53 * — AB

## 2021-01-21 LAB — CBC AND DIFFERENTIAL
Baso # K/uL: 0 10*3/uL (ref 0.0–0.1)
Basophil %: 0 %
Eos # K/uL: 0 10*3/uL (ref 0.0–0.4)
Eosinophil %: 0 %
Hematocrit: 23 % — ABNORMAL LOW (ref 34–45)
Hemoglobin: 7.5 g/dL — ABNORMAL LOW (ref 11.2–15.7)
Lymph # K/uL: 1.6 10*3/uL (ref 1.2–3.7)
Lymphocyte %: 49.9 %
MCH: 30 pg (ref 26–32)
MCHC: 32 g/dL (ref 32–36)
MCV: 93 fL (ref 79–95)
Mono # K/uL: 0.1 10*3/uL — ABNORMAL LOW (ref 0.2–0.9)
Monocyte %: 3.6 %
Neut # K/uL: 1.3 10*3/uL — ABNORMAL LOW (ref 1.6–6.1)
Platelets: 34 10*3/uL — ABNORMAL LOW (ref 160–370)
RBC: 2.5 MIL/uL — ABNORMAL LOW (ref 3.9–5.2)
RDW: 17.7 % — ABNORMAL HIGH (ref 11.7–14.4)
Seg Neut %: 40.2 %
WBC: 3.1 10*3/uL — ABNORMAL LOW (ref 4.0–10.0)

## 2021-01-21 LAB — ABO/RH: ABO RH Blood Type: A POS

## 2021-01-21 LAB — DIFF MANUAL
Bands %: 3 % (ref 0–10)
Diff Based On: 112 CELLS
Myelocyte %: 4 % — ABNORMAL HIGH (ref 0–0)

## 2021-01-21 LAB — MAGNESIUM: Magnesium: 2.2 mg/dL (ref 1.6–2.5)

## 2021-01-21 LAB — LACTATE DEHYDROGENASE: LD: 275 U/L — ABNORMAL HIGH (ref 118–225)

## 2021-01-21 NOTE — Progress Notes (Signed)
BMT PSYCHOSOCIAL EVALUATION    Hannah Hannah [facility] presents to the BMT clinic to meet with Dr. Aneta Mins for consideration of CAR T-cell therapy for treatment of CLL/DLBCL.  She was accompanied to the appointment today by her sister.  Writer's contact with patient was via face-to-face PPE used: mask and eye protection.  Was patient masked during visit: Yes.      DEMOGRAPHICS: Mrs. Hannah Hannah [facility] is a 65 year old Caucasian female who is married with 4 children, and 5 grandchildren.  She has 6  siblings.  Both of her parents are deceased.    CITIZENSHIP:  [place]    RESIDENCE/HOUSING:   Mrs. Hannah Hannah [facility] lives with her husband in [place], [place].  There are 5 steps to enter the home.  The bedroom and bathroom are on the first floor.  There are no concerns about her home setting.  There is a cat at home.  The patient has been educated about lodging options including the [place], and [employer].  The patient will access local lodging as needed.     TRANSPORTATION:  Mrs. [name] is independent with transportation at the moment.  In the event that she cannot provide for her own transportation family is available to assist.  There are no concerns about having access to transportation throughout the course of her treatment and recovery.    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  She is a retired Marketing executive.  She has been educated about disability options including short term disability, long term disability, and Social Security Disability.      PRIMARY LANGUAGE:  English.    MENTAL STATUS CHANGES:  Mrs. Arcilla is alert and oriented x3, and is easily engaged in conversation.  She is casually dressed, and is well groomed.  She has completed the distress screen and notes that her distress level is presently at a 0/10 on the distress thermometer.  Her depression is mild (5-9) based on a PHQ Total Score: 6 and this has not made it difficult  for her to do her work, take care of thing at home, or to get along with others.  Akela completed a screening questionnaire for anxiety today (GAD7) with results of GAD-7 Score: 0, consistent with minimal anxiety.  She denies any depressive symptoms or adjustment issues at this time.  She denies any past history with depression or other mental health concerns.  Social work will continue to follow providing psychosocial support, and ongoing assessment of the patient's coping and adjustment.    DIAGNOSIS:  Mrs. Hannah Hannah [facility] is diagnosed with CLL/DLBCL, and is here for consideration of CAR T-cell therapy.    CURRENT ADLs:  Mrs. Hannah Hannah [facility] is currently independent with all of her ADL needs.  There is no indication that this should change significantly over the course of her treatment and recovery.  In the event that she may need additional assistance her family is available and willing to help.      HOBBIES/INTERESTS:  Mrs. Hannah Hannah Moore enjoys the following hobbies reading, puzzles, and walks.    INSURANCE:  Mrs. Hannah Moore has health insurance coverage through [employer] - switching to Medicare and a supplement.  She  does not have any concerns about coverage.  She has been educated about resources that may be available to her through the [facility], and the [facility].  The patient will follow up with social work for referral to programs and  supports as needed.    PRIMARY SUPPORTS:  Mrs. Hannah Hannah [facility] identifies her primary support to be derived from her husband, and her sister.  She has additional supports available through family, friends, and community.    SPIRITUALITY/BELIEFS:  Mrs. Hannah Hannah Moore states that spirituality plays a significant role in her coping and adjustment with this illness.    ACTIONS:  Mrs. Hannah Hannah Moore has a health care proxy on file in her electronic medical record.      FINANCIAL COORDINATOR:  [name]    BMT NURSE COORDINATOR:  [name], RN    IMPRESSIONS:  Hannah Hannah Moore presents to the BMT clinic to meet with Dr. [name] for consideration of CAR T-cell therapy for treatment of CLL/DLBCL.  She was accompanied to the appointment today by her sister.  [name] is a 65 year old Caucasian female who is married with 4 children, and 5 grandchildren.  She has 6  siblings.     She lives with her husband in [place], [place].  There are 5 steps to enter the home.  The bedroom and bathroom are on the first floor. There are no concerns about her home setting.  There is a cat at home.  The patient has been educated about lodging options including the [place], and [employer].  The patient will access local lodging as needed. Mrs. Hannah Hannah Moore is independent with transportation at the moment.  In the event that she cannot provide for her own transportation family is available to assist.  There are no concerns about having access to transportation throughout the course of her treatment and recovery.    Mrs. Hannah Hannah Moore is alert and oriented x3, and is easily engaged in conversation.  she is casually dressed, and is well groomed.  She denies any depressive symptoms or adjustment issues at this time.  She denies any past history with depression or other mental health concerns.  Social work will continue to follow providing psychosocial support, and ongoing assessment of the patient's coping and adjustment.      She has been educated about resources that may be available to her.  These resources include, but are not limited to the [facility], [facility] [employer], the [facility] Financial Assistance Program, and the Social Security Administration.  Should any needs arise prior to her next visit please notify social work so that I may provide assistance at that time.       Mrs.  Hannah Hannah [facility] presents for consideration of CAR T-cell therapy.  She is alert and oriented x3, and is fully engaged in the discussion.  She states that her current level of distress is at 0/10.  Her PHQ score suggests that her experience with depression is mild.  Her GAD-7 score suggests that her experience with anxiety is minimal.  She reports having good support from her faith and church community.  She appears to have good support from her family.  There are no concerns from either the patient or her sister about their ability to mobilize support throughout the course of her treatment and recovery.  Social work will continue to follow providing psychosocial support, resource mobilization, and ongoing assessment of her coping and adjustment.  Should any needs arise prior to her next visit please notify social work so that I may provide assistance at that time.       Marita Snellen, LMSW  Blood & Marrow Transplant Social Worker  [phone]

## 2021-01-21 NOTE — Patient Instructions (Signed)
The office phone number is:  [phone]  CAR-T      This phone will be answered by a secretary who will take your message Monday - Friday, 8:00 am - 5:00 pm.   Please be prepared to let the secretary know why you are calling.  This will help let your nurse know how urgent the call is.       This is also the same number that you should call after hours for any urgent needs.  There is always a fellow on call.     Your Team Members:  Dr. 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  Chimeric antigen receptor (CAR) T cell therapy is an immunotherapy approach that involves engineering your immune cells to:  Recognize tumor cells as foreign  Expand your T cells  Reactivate your T cells to kill target cancer cells    Testing prior to moving forward:  Echocardiogram  To confirm that your heart is functioning well.  This test evaluates the function of the cardiac muscle.    Blood work  A blood draw will be done to check your hematology, chemistry, clotting, blood type, and a panel to determine if you have been exposed to any infectious diseases.    We will review these tests at your next appointment and if able to move forward we will discuss the apheresis process to collect the cells, how those cells are manipulated to attack your cancer and expanded, chemotherapy and admission.

## 2021-01-21 NOTE — H&P (Addendum)
Hannah Moore H&P    Diagnosis: Richter's transformation    History of Present Illness:  The patient is a 65 y.o. female with history of breast cancer (T1cN0M0, ER/PR negative, HER2 positive) and Hannah Moore transformation who presents for initial evaluation for CART cell therapy.    Patient was diagnosed with breast cancer in 2009, she underwent a lymph node biopsy that incidentally showed CLL. She underwent mastectomy, chemotherapy and RT for breast cancer Hannah Moore And then herceptin for 1 year until 05/2009). In 2011, she had rising WBC count and anemia. 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 She was started on rituxan/bendamustine in 10/2010. She then had recurrence of disease with worsening counts and progressive lymphadenopathy in 02/2014 and was started on FCR. Repeat FISH at that time showed persistent 13q deletion and development of new 17p deletion. She was started on imbruvica on 08/29/2014. He disease was stable until 2018 when she developed worsening pancytopenia. She had a repeat BM biopsy that showed 70% involvement. 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 She was also found to have bulky lymphadenopathy and splenomegaly on imaging. She was started on venetoclax with several dose delays and reductions and need for neulasta. Rituximab was added in Feb 2019.    In August 2021, patient developed epiglotitis/pharyngitis and was admitted to the [facility]. 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 She started RCHOP in 05/2020 and received 3 cycles with RT to bulky disease in neck which was completed in Feb 20211.  Post treatment PET scan showed interval increase of FDG avidity in L palatine tonsil, left axillary lymph node and interval appearance of multiple FDG avid foci throughout the liver. New or increased foci of uptake in the pelvis and spine  including a new R iliac lesion.     Patient underwent FNA from liver lesions that showed DLBCL, no rearrangement of MYC or BCL6. BM Bx showed CLL/SLL involving 70-80% of the marrow cellularity. Patient was started on R-ICE on 12/16/20 however developed ifosfamide toxicity with encephalopathy. Further doses were placed on hold and she was referred to [facility] to discuss further steps. She presents today for consideration of CART cell therapy.    Patient is overall feeling better since she has been off chemotherapy. She is recovering from encephalopathy when she had hallucinations and confusion. She endorses fatigue. No night sweats, fever, chills, infections. Continues to loose weight and has decreased appetite. Also has neuropathy in toes and constipation. No new lymphadenopathy. Patient has odynophagia/dysphagia likely from lesion in the tonsil, she describes it as a sore.     Social History:  Denies tobacco or alcohol use. Occupation: retired Education officer, museum.    Family History:  Mother with breast cancer and sibling with HPV+ cervical cancer. Has 4 healthy children (37, 60, 55 and 46, the 97 year old one has ulcerative colitis).     Allergies:  Allergies   Allergen Reactions    Adhesive Tape Rash    Amoxicillin Rash       Medications:  Outpatient Medications Marked as Taking for the 01/21/21 encounter (Office Visit) with Hannah Alas, MD   Medication Sig Dispense Refill    docusate sodium (COLACE) 100 MG capsule Take 100 mg by mouth 2 times daily as needed for Constipation      levothyroxine (SYNTHROID, LEVOTHROID) 75 mcg tablet Take 50 mcg by  mouth daily  Patient reports she takes 90mg      tolterodine (DETROL) 1 MG tablet Take 1 mg by mouth daily         Review of Systems   Constitutional: Positive for malaise/fatigue. Negative for diaphoresis, fever and weight loss.   HENT: Negative for sore throat.    Respiratory: Negative for cough and shortness of breath.    Cardiovascular: Negative for chest pain and leg  swelling.   Gastrointestinal: Negative for abdominal pain, diarrhea, nausea and vomiting.   Skin: Negative for rash.   Neurological: Negative for sensory change, speech change and focal weakness.   Endo/Heme/Allergies: Does not bruise/bleed easily.   Psychiatric/Behavioral: Negative for depression. The patient is not nervous/anxious.        Physical Exam:  Vitals:    01/21/21 1100   BP: 96/56   Pulse: 102   Resp: 10   Temp: 36.2 C (97.2 F)   Weight: 52.8 kg (116 lb 6.5 oz)   Height: 171 cm (5' 7.32")       ECOG PERFORMANCE STATUS: 2 KPS: 60-70%    General: Pleasant, age appropriate, in no acute distress, chronically ill appearing  HEENT: No scleral icterus, oral mucosa is pink and moist, no thrush  Lymphatics: No cervical, supraclavicular, axillary, or inguinal lymphadenopathy  Cardiac: S1, S2 regular rate and rhythm, no murmurs, rubs or gallops.  No lower extremity edema  Pulmonary: Clear to ausculation bilaterally, no rales, wheezes or rhonchi  Abdomen: Bowel sounds present, soft, nontender, no masses or hepatosplenomegaly  Extremities: No joint swelling or tenderness   Skin: No rashes or petechiae  Neuro: Awake, alert and oriented to person, place, time and situation.  EOMI, PERRL, no facial droop, no dysarthria  Psychiatric:  Full affect    Diagnostic studies reviewed:      Lab results: 01/21/21  1443   Sodium 138   Potassium 4.6   Chloride 98   CO2 25   UN 23*   Creatinine 1.15*   Glucose 119*   Calcium 10.2   Total Protein 5.8*   Albumin 4.1   ALT 28   AST 32   Alk Phos 200*   Bilirubin,Total 0.5       Lab Results   Component Value Date/Time    WBC 3.1 (L) 01/21/2021 02:43 PM    RBC 2.5 (L) 01/21/2021 02:43 PM    HGB 7.5 (L) 01/21/2021 02:43 PM    HCT 23 (L) 01/21/2021 02:43 PM    MCV 93 01/21/2021 02:43 PM    RDW 17.7 (H) 01/21/2021 02:43 PM    PLT 34 (L) 01/21/2021 02:43 PM    SEGR 40.2 01/21/2021 02:43 PM    LYMPR 49.9 01/21/2021 02:43 PM    BAND 3 01/21/2021 02:43 PM    MONOR 3.6 01/21/2021 02:43 PM     EOSR 0.0 01/21/2021 02:43 PM    BASOR 0.0 01/21/2021 02:43 PM    ASEGR 1.3 (L) 01/21/2021 02:43 PM    ALYMR 1.6 01/21/2021 02:43 PM    AMONR 0.1 (L) 01/21/2021 02:43 PM    AEOSR 0.0 01/21/2021 02:43 PM    ABASR 0.0 01/21/2021 02:43 PM       Assessment and Plan:    The patient is a 65y.o. female with history of breast cancer (T1cN0M0, ER/PR negative, HER2 positive) and Richter transformation who presents for initial evaluation for CART cell therapy.    Richter's transformation  Patient has a long history of CLL (with 17p deletion)  that progressed to Richter's transformation in 2021. She was treated with RCHOP x3 cycles and RT however she rapidly had progression of disease with new liver hypermetabolic lesions that were biopsy-proven DLBCL and was given salvage therapy with R-ICE. She had significant toxicity from ifosfamide after cycle 1 so further therapy was held. She has been referred to [place] for consideration for CART cell therapy.    We reviewed Richter's transformation (RT) and prognosis and discussed the use of CART cell therapy in this setting. We discussed that we would prefer to use liso-cel CART cell product however this has not been FDA approved yet for Richter's transformation and we would seek approval given her DLBCL transformation. The Zuma-7 study with Axicabtagene ciloleucel has not included patients with RT. We mentioned that one potential limitation will be the timing and manufacturing with lisocel. Another treatment approach will be the use of immunotherapy (pembrolizumab). A small phase 2 study examined the use of pembrolizumab in patients who developed RT (included 9 patients) and found an objective response rate of 44% in patients with RT and median OS of 10.7 months. We will also look for clinical trials available in our institution for RT.     Given that she has been off therapy for >1 month, we would like to obtain a PET scan to further evaluate her disease status. We will regroup with  results and continue to discuss next steps.     All of their questions were answered in detail and they are in agreement with the plan.    Patient was seen and discussed with Dr. Aneta Mins.    Loa Socks, MD  Hematology/Oncology Fellow, [facility] [phone]    The patient was seen and examined with Dr Alvira Monday.  I agree with the assessment and plan as detailed in the above note    65 y.o. female with diffuse large B-cell lymphoma transformed from CLL who presents to discuss possible treatment with anti-CD19 CAR T-cells.  We discussed treatment with lisocabtagene maraleucel given its activity in both CLL and DLBCL.  I do think that she would be a reasonable candidate for this.  First I would like to assess her disease status with a PET CT.    Rest of the plan per Dr Alvira Monday' note    Hannah Alas, MD  Hematology Attending

## 2021-01-22 ENCOUNTER — Encounter: Payer: Self-pay | Admitting: Oncology

## 2021-01-22 LAB — BMT TRANSPLANT PANEL
EBV VCA IgG: 2.8 AI — ABNORMAL HIGH
HSV 1 IgG: 1 AI
HSV 2 IgG: <0.2 AI

## 2021-01-25 ENCOUNTER — Encounter: Payer: Self-pay | Admitting: Oncology

## 2021-01-26 ENCOUNTER — Encounter: Payer: Self-pay | Admitting: Oncology

## 2021-01-27 LAB — BMT TRANSPLANT PANEL
CMV Total Ab: POSITIVE
Chagas IgG AB: NONREACTIVE
EBV VCA IgG: 2.8 AI — ABNORMAL HIGH
HBV DNA (NAT): NONREACTIVE
HCV RNA (NAT): NONREACTIVE
HIV-1 RNA (NAT): NONREACTIVE
HIV-1/2+O Ab: NONREACTIVE
HSV 1 IgG: 1 AI
HSV 2 IgG: <0.2 AI
HTLV-I/II Ab: NONREACTIVE
Hep C AB: NONREACTIVE
VZV IgG: POSITIVE

## 2021-01-29 ENCOUNTER — Inpatient Hospital Stay: Admit: 2021-01-29 | Discharge: 2021-01-29 | Disposition: A | Discharge status: Home / Self Care | Payer: Self-pay

## 2021-01-29 ENCOUNTER — Other Ambulatory Visit: Payer: Self-pay

## 2021-01-30 ENCOUNTER — Other Ambulatory Visit: Payer: Self-pay

## 2021-01-30 DIAGNOSIS — C8330 Diffuse large B-cell lymphoma, unspecified site: Secondary | ICD-10-CM

## 2021-01-30 DIAGNOSIS — Z52018 Autologous donor, other blood: Secondary | ICD-10-CM

## 2021-02-02 ENCOUNTER — Encounter: Payer: Self-pay | Admitting: Oncology

## 2021-02-02 NOTE — Patient Instructions (Signed)
The office phone number is:  [phone]  CAR-T      This phone will be answered by a secretary who will take your message Monday - Friday, 8:00 am - 5:00 pm.   Please be prepared to let the secretary know why you are calling.  This will help let your nurse know how urgent the call is.       This is also the same number that you should call after hours for any urgent needs.  There is always a fellow on call.     Your Team Members:  Dr. Elon Alas  Chilton Greathouse, LMSW  Denman George, RN  Danicia Terhaar, RN  Dicie Beam, secretary     Diagnosis:  Hannah [facility] transformedlimited stage diffuse large B-cell lymphoma    What is CAR T therapy?  Chimeric antigen receptor (CAR) T cell therapy is an immunotherapy approach that involves engineering your immune cells to:  Recognize tumor cells as foreign  Expand your T cells  Reactivate your T cells to kill target cancer cells    Testing prior to moving forward:  Echocardiogram  To confirm that your heart is functioning well.  This test evaluates the function of the cardiac muscle.    Blood work  A blood draw will be done to check your hematology, chemistry, clotting, blood type, and a panel to determine if you have been exposed to any infectious diseases.    We will review these tests at your next appointment and if able to move forward we will discuss the apheresis process to collect the cells, how those cells are manipulated to attack your cancer and expanded, chemotherapy and admission.         June 2022      Sunday Monday Tuesday Wednesday Thursday Friday Saturday                  1     2    RIS EXTERNAL FILMS   2:15 PM  [facility] 3     4       5     6     7    FOLLOW UP VISIT  10:20 AM  Dr. Patrick Reagan  [facility] [address]     9     10     11       12     13     14     15     16  IR TUNNELED CENTRAL LINE PLCMT   7:30 AM  [facility]    APHERESIS  Please arrive to apheresis right after  you line placement.  17    IR DISCONTINUE LINE   1:10 PM  [facility] 18       19     20     21     22     23     24     25       26     27     28     29     30 

## 2021-02-03 ENCOUNTER — Ambulatory Visit: Payer: PRIVATE HEALTH INSURANCE | Admitting: Oncology

## 2021-02-03 ENCOUNTER — Ambulatory Visit: Payer: PRIVATE HEALTH INSURANCE | Attending: Oncology | Admitting: Oncology

## 2021-02-03 ENCOUNTER — Encounter: Payer: Self-pay | Admitting: Oncology

## 2021-02-03 VITALS — BP 76/44 | HR 120 | Temp 98.4°F | Resp 17 | Ht 67.32 in | Wt 114.0 lb

## 2021-02-03 DIAGNOSIS — C8330 Diffuse large B-cell lymphoma, unspecified site: Secondary | ICD-10-CM

## 2021-02-03 DIAGNOSIS — C9110 Chronic lymphocytic leukemia of B-cell type not having achieved remission: Secondary | ICD-10-CM

## 2021-02-03 NOTE — Progress Notes (Addendum)
[facility] Progress Note    Diagnosis: Richter's transformation    Oncologic History:  -Patient was diagnosed with breast cancer in 2009, she underwent a lymph node biopsy that incidentally showed CLL. She underwent mastectomy, chemotherapy and RT for breast cancer [facility] And then herceptin for 1 year until 05/2009).   -In 2011, she had rising WBC count and anemia. 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 She was started on rituxan/bendamustine in 10/2010.  - She then had recurrence of disease with worsening counts and progressive lymphadenopathy in 02/2014 and was started on FCR. Repeat FISH at that time showed persistent 13q deletion and development of new 17p deletion.   -She was started on ibrutinib on 08/29/2014.   -Her disease was stable until 2018 when she developed worsening pancytopenia. She had a repeat BM biopsy that showed 70% involvement. 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 She was also found to have bulky lymphadenopathy and splenomegaly on imaging.   -She was started on venetoclax with several dose delays and reductions and need for neulasta. Rituximab was added in Feb 2019.  -In August 2021, patient developed epiglotitis/pharyngitis and was admitted to the hospital. 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 She started RCHOP in 05/2020 and received 3 cycles with RT to bulky disease in neck which was completed in Feb 20211.  Post treatment PET scan showed interval increase of FDG avidity in L palatine tonsil, left axillary lymph node and interval appearance of multiple FDG avid foci throughout the liver. New or increased foci of uptake in the pelvis and spine including a new R iliac lesion.   -Patient underwent FNA from liver lesions that showed DLBCL, no rearrangement of MYC or BCL6. BM Bx showed CLL/SLL involving 70-80% of the  marrow cellularity. Patient was started on R-ICE on 12/16/20 however developed ifosfamide toxicity with encephalopathy. Further doses were placed on hold and she was referred to [facility] to discuss further steps    History of Present Illness:  65 y.o. female who returns for follow up of CLL with Richter's transformation.  She has continued to note fatigue, decreased appetite, early satiety, dyspnea with exertion.  She has not noted any enlarging lymph nodes.      The patient's allergies, medications, past medical, social and family histories were reviewed and updated in the electronic medical record    Medications:  Outpatient Medications Marked as Taking for the 02/03/21 encounter (Office Visit) with Elon Alas, MD   Medication Sig Dispense Refill    allopurinol (ZYLOPRIM) 300 mg tablet Take 300 mg by mouth daily       Review of Systems   Constitutional: Positive for malaise/fatigue and weight loss. Negative for diaphoresis and fever.   HENT: Negative for sore throat.    Eyes: Negative for blurred vision.   Respiratory: Negative for cough and shortness of breath.    Cardiovascular: Negative for chest pain and leg swelling.   Gastrointestinal: Negative for abdominal pain, constipation, diarrhea, nausea and vomiting.        Decreased appetite, early satiety   Genitourinary: Negative for dysuria.   Musculoskeletal: Negative for myalgias.   Skin: Negative for rash.   Neurological: Negative for sensory change, speech change and focal weakness.   Endo/Heme/Allergies: Bruises/bleeds easily.   Psychiatric/Behavioral: Negative for depression. The patient is not nervous/anxious.  Physical Exam:  Vitals:    02/03/21 1022   BP: (!) 76/44   Pulse: (!) 120   Resp: 17   Temp: 36.9 C (98.4 F)   Weight: 51.7 kg (113 lb 15.7 oz)   Height: 171 cm (5' 7.32")     ECOG PERFORMANCE STATUS: (2) Ambulatory and capable of self care, unable to carry out work activity, up and about > 50% or waking hours    General: Appears fatigued, but  in no acute distress.  Cachexia noted  HEENT: No scleral icterus, oral mucosa is pink and moist, no thrush  Neuro: Awake, alert and oriented to person, place, time and situation.  PERRL, EOMI, no facial droop, no dysarthria  Psych: affect is full    Diagnostic studies reviewed:      Lab results: 01/21/21  1443   Sodium 138   Potassium 4.6   Chloride 98   CO2 25   UN 23*   Creatinine 1.15*   Glucose 119*   Calcium 10.2   Total Protein 5.8*   Albumin 4.1   ALT 28   AST 32   Alk Phos 200*   Bilirubin,Total 0.5       Lab Results   Component Value Date/Time    WBC 3.1 (L) 01/21/2021 02:43 PM    RBC 2.5 (L) 01/21/2021 02:43 PM    HGB 7.5 (L) 01/21/2021 02:43 PM    HCT 23 (L) 01/21/2021 02:43 PM    MCV 93 01/21/2021 02:43 PM    RDW 17.7 (H) 01/21/2021 02:43 PM    PLT 34 (L) 01/21/2021 02:43 PM    SEGR 40.2 01/21/2021 02:43 PM    LYMPR 49.9 01/21/2021 02:43 PM    BAND 3 01/21/2021 02:43 PM    MONOR 3.6 01/21/2021 02:43 PM    EOSR 0.0 01/21/2021 02:43 PM    BASOR 0.0 01/21/2021 02:43 PM    ASEGR 1.3 (L) 01/21/2021 02:43 PM    ALYMR 1.6 01/21/2021 02:43 PM    AMONR 0.1 (L) 01/21/2021 02:43 PM    AEOSR 0.0 01/21/2021 02:43 PM    ABASR 0.0 01/21/2021 02:43 PM     PET CT reviewed  Marked progression of infra and supradiaphragmatic lymphadenopathy as well as liver disease    Assessment and Plan:  65 y.o. female with Richter's transformation who returns for follow up    Richter's transformation  I reviewed the PET CT with her in detail.  We also discussed the limitations of CAR T-cell therapy including the time delays with insurance approval, collection and manufacturing.  There would also be a very narrow therapeutic window with her performance status and high tumor burden.  Ultimately I do not think that this is a good choice for her at this time.    We discussed other approaches including pembrolizumab and a comfort based approach through Hospice.  I explained to her that given the tolerability of pembrolizumab and a response  rate of ~40% ([email or web address]), I do think that it would be reasonable to consider this and she is in agreement.      Pembrolizumab would be dosed as follows:  pembrolizumab 200 mg every 3 weeks until progression or intolerance.  I have left a message with Dr Julious Payer office as hopefully she can receive this with him    I will plan to see her back in 2 weeks with a video visit    The patient's questions were answered to satisfaction and she agrees with the plan as outlined  above.  40 minutes were spent on the encounter on 02/03/2021    Elon Alas, MD  Hematology Attending

## 2021-02-10 ENCOUNTER — Ambulatory Visit: Payer: PRIVATE HEALTH INSURANCE | Admitting: Oncology

## 2021-02-12 ENCOUNTER — Other Ambulatory Visit: Payer: PRIVATE HEALTH INSURANCE

## 2021-02-13 ENCOUNTER — Other Ambulatory Visit: Payer: PRIVATE HEALTH INSURANCE

## 2021-02-13 LAB — SURGICAL PATHOLOGY

## 2021-02-17 ENCOUNTER — Ambulatory Visit: Payer: PRIVATE HEALTH INSURANCE | Admitting: Oncology

## 2021-02-27 DEATH — deceased

## 2021-03-11 ENCOUNTER — Other Ambulatory Visit: Payer: PRIVATE HEALTH INSURANCE

## 2021-04-24 NOTE — Telephone Encounter (Signed)
No additional documentation needed.

## 2021-05-11 NOTE — Telephone Encounter (Signed)
No additional documentation needed.

## 4654-11-29 DEATH — deceased
